# Patient Record
Sex: Female | Born: 1960 | Race: White | Hispanic: No | Smoking: Never smoker
Health system: Southern US, Community
[De-identification: ages and names within clinical notes are randomized; demographics above are authoritative.]

## PROBLEM LIST (undated history)

## (undated) DIAGNOSIS — E079 Disorder of thyroid, unspecified: Secondary | ICD-10-CM

## (undated) HISTORY — PX: MIDDLE EAR SURGERY: SHX713

## (undated) HISTORY — PX: TONSILLECTOMY: SUR1361

---

## 2004-06-29 ENCOUNTER — Ambulatory Visit: Payer: Self-pay | Admitting: Internal Medicine

## 2004-07-09 ENCOUNTER — Ambulatory Visit: Payer: Self-pay | Admitting: Family Medicine

## 2004-07-26 ENCOUNTER — Ambulatory Visit: Payer: Self-pay | Admitting: Family Medicine

## 2004-07-27 ENCOUNTER — Ambulatory Visit: Payer: Self-pay | Admitting: *Deleted

## 2004-08-11 ENCOUNTER — Ambulatory Visit: Payer: Self-pay | Admitting: Family Medicine

## 2004-09-09 ENCOUNTER — Emergency Department (HOSPITAL_COMMUNITY): Admission: EM | Admit: 2004-09-09 | Discharge: 2004-09-09 | Payer: Self-pay | Admitting: Emergency Medicine

## 2004-10-25 ENCOUNTER — Ambulatory Visit (HOSPITAL_COMMUNITY): Admission: RE | Admit: 2004-10-25 | Discharge: 2004-10-25 | Payer: Self-pay | Admitting: Internal Medicine

## 2004-10-25 ENCOUNTER — Ambulatory Visit: Payer: Self-pay | Admitting: Internal Medicine

## 2005-05-05 ENCOUNTER — Ambulatory Visit: Payer: Self-pay | Admitting: Family Medicine

## 2006-05-18 ENCOUNTER — Ambulatory Visit: Payer: Self-pay | Admitting: Family Medicine

## 2006-05-18 ENCOUNTER — Encounter (INDEPENDENT_AMBULATORY_CARE_PROVIDER_SITE_OTHER): Payer: Self-pay | Admitting: Family Medicine

## 2006-06-11 ENCOUNTER — Emergency Department (HOSPITAL_COMMUNITY): Admission: EM | Admit: 2006-06-11 | Discharge: 2006-06-11 | Payer: Self-pay | Admitting: Emergency Medicine

## 2006-07-10 ENCOUNTER — Ambulatory Visit: Payer: Self-pay | Admitting: Family Medicine

## 2006-09-26 ENCOUNTER — Ambulatory Visit: Payer: Self-pay | Admitting: Internal Medicine

## 2006-11-14 ENCOUNTER — Ambulatory Visit (HOSPITAL_COMMUNITY): Admission: RE | Admit: 2006-11-14 | Discharge: 2006-11-14 | Payer: Self-pay | Admitting: Family Medicine

## 2006-11-20 ENCOUNTER — Ambulatory Visit: Payer: Self-pay | Admitting: Internal Medicine

## 2006-11-20 LAB — CONVERTED CEMR LAB
LDL Cholesterol: 109 mg/dL — ABNORMAL HIGH (ref 0–99)
Total CHOL/HDL Ratio: 2.9
VLDL: 10 mg/dL (ref 0–40)

## 2006-11-22 ENCOUNTER — Encounter: Admission: RE | Admit: 2006-11-22 | Discharge: 2006-11-22 | Payer: Self-pay | Admitting: Family Medicine

## 2007-07-05 ENCOUNTER — Emergency Department (HOSPITAL_COMMUNITY): Admission: EM | Admit: 2007-07-05 | Discharge: 2007-07-05 | Payer: Self-pay | Admitting: General Surgery

## 2007-07-12 ENCOUNTER — Emergency Department (HOSPITAL_COMMUNITY): Admission: EM | Admit: 2007-07-12 | Discharge: 2007-07-12 | Payer: Self-pay | Admitting: Emergency Medicine

## 2007-07-31 ENCOUNTER — Emergency Department (HOSPITAL_COMMUNITY): Admission: EM | Admit: 2007-07-31 | Discharge: 2007-07-31 | Payer: Self-pay | Admitting: Family Medicine

## 2007-08-21 ENCOUNTER — Emergency Department (HOSPITAL_COMMUNITY): Admission: EM | Admit: 2007-08-21 | Discharge: 2007-08-21 | Payer: Self-pay | Admitting: Family Medicine

## 2007-10-04 ENCOUNTER — Ambulatory Visit: Payer: Self-pay | Admitting: Internal Medicine

## 2007-11-13 ENCOUNTER — Ambulatory Visit: Payer: Self-pay | Admitting: Internal Medicine

## 2008-01-28 ENCOUNTER — Encounter (INDEPENDENT_AMBULATORY_CARE_PROVIDER_SITE_OTHER): Payer: Self-pay | Admitting: Family Medicine

## 2008-01-28 ENCOUNTER — Ambulatory Visit: Payer: Self-pay | Admitting: Family Medicine

## 2008-02-06 ENCOUNTER — Encounter (INDEPENDENT_AMBULATORY_CARE_PROVIDER_SITE_OTHER): Payer: Self-pay | Admitting: Family Medicine

## 2008-02-06 ENCOUNTER — Ambulatory Visit: Payer: Self-pay | Admitting: Internal Medicine

## 2008-02-06 LAB — CONVERTED CEMR LAB
ALT: 13 units/L (ref 0–35)
Albumin: 4.1 g/dL (ref 3.5–5.2)
Alkaline Phosphatase: 46 units/L (ref 39–117)
Basophils Absolute: 0 10*3/uL (ref 0.0–0.1)
Calcium: 8.3 mg/dL — ABNORMAL LOW (ref 8.4–10.5)
Creatinine, Ser: 0.65 mg/dL (ref 0.40–1.20)
Eosinophils Relative: 4 % (ref 0–5)
Lymphocytes Relative: 37 % (ref 12–46)
Lymphs Abs: 2.1 10*3/uL (ref 0.7–4.0)
MCHC: 31.7 g/dL (ref 30.0–36.0)
Neutrophils Relative %: 47 % (ref 43–77)
Platelets: 313 10*3/uL (ref 150–400)
Potassium: 4.5 meq/L (ref 3.5–5.3)
RDW: 15.8 % — ABNORMAL HIGH (ref 11.5–15.5)
Total CHOL/HDL Ratio: 2.8
Total Protein: 7.1 g/dL (ref 6.0–8.3)
Triglycerides: 75 mg/dL (ref <150)

## 2008-02-11 ENCOUNTER — Encounter (INDEPENDENT_AMBULATORY_CARE_PROVIDER_SITE_OTHER): Payer: Self-pay | Admitting: Family Medicine

## 2008-02-11 LAB — CONVERTED CEMR LAB: Folate: 15.8 ng/mL

## 2008-02-20 ENCOUNTER — Ambulatory Visit (HOSPITAL_COMMUNITY): Admission: RE | Admit: 2008-02-20 | Discharge: 2008-02-20 | Payer: Self-pay | Admitting: Family Medicine

## 2008-03-17 ENCOUNTER — Ambulatory Visit: Payer: Self-pay | Admitting: Internal Medicine

## 2008-04-29 ENCOUNTER — Ambulatory Visit: Payer: Self-pay | Admitting: Internal Medicine

## 2009-01-10 ENCOUNTER — Emergency Department (HOSPITAL_COMMUNITY): Admission: EM | Admit: 2009-01-10 | Discharge: 2009-01-10 | Payer: Self-pay | Admitting: Emergency Medicine

## 2009-02-26 ENCOUNTER — Emergency Department (HOSPITAL_COMMUNITY): Admission: EM | Admit: 2009-02-26 | Discharge: 2009-02-26 | Payer: Self-pay | Admitting: Emergency Medicine

## 2009-07-30 ENCOUNTER — Emergency Department (HOSPITAL_COMMUNITY): Admission: EM | Admit: 2009-07-30 | Discharge: 2009-07-30 | Payer: Self-pay | Admitting: Family Medicine

## 2009-08-26 ENCOUNTER — Ambulatory Visit: Payer: Self-pay | Admitting: Internal Medicine

## 2009-08-26 LAB — CONVERTED CEMR LAB
AST: 16 units/L (ref 0–37)
Alkaline Phosphatase: 62 units/L (ref 39–117)
BUN: 16 mg/dL (ref 6–23)
Basophils Absolute: 0.1 10*3/uL (ref 0.0–0.1)
CO2: 26 meq/L (ref 19–32)
Chloride: 101 meq/L (ref 96–112)
Creatinine, Ser: 0.66 mg/dL (ref 0.40–1.20)
Ferritin: 28 ng/mL (ref 10–291)
Glucose, Bld: 87 mg/dL (ref 70–99)
HCT: 41.4 % (ref 36.0–46.0)
HDL: 60 mg/dL (ref >39)
Hemoglobin: 13.9 g/dL (ref 12.0–15.0)
Iron: 73 ug/dL (ref 42–145)
MCHC: 33.6 g/dL (ref 30.0–36.0)
Neutro Abs: 4 10*3/uL (ref 1.7–7.7)
Potassium: 4.8 meq/L (ref 3.5–5.3)
RBC: 4.59 M/uL (ref 3.87–5.11)
Saturation Ratios: 21 % (ref 20–55)
Sodium: 139 meq/L (ref 135–145)
Total CHOL/HDL Ratio: 3.6
UIBC: 279 ug/dL
VLDL: 35 mg/dL (ref 0–40)
Vitamin B-12: 589 pg/mL (ref 211–911)
WBC: 7.7 10*3/uL (ref 4.0–10.5)

## 2009-09-09 ENCOUNTER — Ambulatory Visit (HOSPITAL_COMMUNITY): Admission: RE | Admit: 2009-09-09 | Discharge: 2009-09-09 | Payer: Self-pay | Admitting: Internal Medicine

## 2009-11-08 ENCOUNTER — Emergency Department (HOSPITAL_COMMUNITY): Admission: EM | Admit: 2009-11-08 | Discharge: 2009-11-08 | Payer: Self-pay | Admitting: Family Medicine

## 2010-01-03 ENCOUNTER — Emergency Department (HOSPITAL_COMMUNITY): Admission: EM | Admit: 2010-01-03 | Discharge: 2010-01-03 | Payer: Self-pay | Admitting: Family Medicine

## 2011-02-19 ENCOUNTER — Emergency Department (HOSPITAL_COMMUNITY)
Admission: EM | Admit: 2011-02-19 | Discharge: 2011-02-19 | Disposition: A | Discharge status: Home / Self Care | Payer: Self-pay | Attending: Emergency Medicine | Admitting: Emergency Medicine

## 2011-02-19 ENCOUNTER — Encounter: Payer: Self-pay | Admitting: Emergency Medicine

## 2011-02-19 DIAGNOSIS — R51 Headache: Secondary | ICD-10-CM | POA: Insufficient documentation

## 2011-02-19 DIAGNOSIS — J3489 Other specified disorders of nose and nasal sinuses: Secondary | ICD-10-CM | POA: Insufficient documentation

## 2011-02-19 DIAGNOSIS — R059 Cough, unspecified: Secondary | ICD-10-CM | POA: Insufficient documentation

## 2011-02-19 DIAGNOSIS — R05 Cough: Secondary | ICD-10-CM | POA: Insufficient documentation

## 2011-02-19 DIAGNOSIS — Z9889 Other specified postprocedural states: Secondary | ICD-10-CM | POA: Insufficient documentation

## 2011-02-19 DIAGNOSIS — Z79899 Other long term (current) drug therapy: Secondary | ICD-10-CM | POA: Insufficient documentation

## 2011-02-19 DIAGNOSIS — H6690 Otitis media, unspecified, unspecified ear: Secondary | ICD-10-CM | POA: Insufficient documentation

## 2011-02-19 DIAGNOSIS — R0982 Postnasal drip: Secondary | ICD-10-CM | POA: Insufficient documentation

## 2011-02-19 DIAGNOSIS — H6692 Otitis media, unspecified, left ear: Secondary | ICD-10-CM

## 2011-02-19 DIAGNOSIS — H9209 Otalgia, unspecified ear: Secondary | ICD-10-CM | POA: Insufficient documentation

## 2011-02-19 DIAGNOSIS — R070 Pain in throat: Secondary | ICD-10-CM | POA: Insufficient documentation

## 2011-02-19 MED ORDER — OXYMETAZOLINE HCL 0.05 % NA SOLN: 2.0000 | Freq: Two times a day (BID) | NASAL | Status: AC

## 2011-02-19 MED ORDER — AMOXICILLIN 400 MG/5ML PO SUSR: 400.0000 mg | Freq: Three times a day (TID) | ORAL | Status: AC

## 2011-02-19 NOTE — ED Notes (Signed)
Pt states she has been sick for about a week  Pt states when she swallows her throat and ears hurt  Pt states her throat is really sore  Pt states she has had a little bit of a runny nose

## 2011-02-19 NOTE — ED Notes (Signed)
Pt a/o x 4, resting quietly, skin warm and dry, respirations even and unlabored, no acute distress noted

## 2011-02-19 NOTE — ED Provider Notes (Signed)
History     [name]: [id number] Arrival date & time: 02/19/2011  2:45 AM   First MD Initiated Contact with Patient 02/19/11 [phone]      Chief Complaint  Patient presents with  . Sore Throat  . Otalgia    (Consider location/radiation/quality/duration/timing/severity/associated sxs/prior treatment) Patient is a 50 y.o. female presenting with ear pain. The history is limited by the condition of the patient.  Otalgia This is a new problem. The current episode started more than 2 days ago. There is pain in both ears. The problem occurs constantly. The problem has been gradually worsening. There has been no fever. The pain is mild. Associated symptoms include headaches, rhinorrhea, sore throat and cough. Pertinent negatives include no ear discharge, no hearing loss, no abdominal pain, no diarrhea, no vomiting, no neck pain and no rash. Her past medical history is significant for tympanostomy tube.  Patient has had bilateral otalgia, worse when she swallows, for about a week. She additionally complains of sore throat and headache. She has had accompanying runny nose and cough. Patient does have a pertinent past medical history of a "T" tympanostomy tube in the right ear. She states this was placed many years ago, and that she is no longer followed by an ENT for it. Her primary care provider is [facility], and they monitor the tube for her. She denies ear discharge, fevers at home, chills, nausea, vomiting, visual disturbance.  History reviewed. No pertinent past medical history.  Past Surgical History  Procedure Date  . Middle ear surgery   . Tonsillectomy   . Cesarean section     Family History  Problem Relation Age of Onset  . Diabetes Mother   . Hypertension Father     History  Substance Use Topics  . Smoking status: Never Smoker   . Smokeless tobacco: Not on file  . Alcohol Use: Yes     occassional    OB History    Grav Para Term Preterm Abortions TAB SAB Ect Mult Living            Review of Systems  Constitutional: Negative for fever, chills, diaphoresis, activity change, appetite change, fatigue and unexpected weight change.  HENT: Positive for ear pain, congestion, sore throat, rhinorrhea and postnasal drip. Negative for hearing loss, nosebleeds, facial swelling, trouble swallowing, neck pain, neck stiffness, dental problem, voice change, sinus pressure, tinnitus and ear discharge.   Eyes: Negative for photophobia, discharge, redness and visual disturbance.  Respiratory: Positive for cough. Negative for chest tightness and wheezing.   Cardiovascular: Negative for chest pain and palpitations.  Gastrointestinal: Negative for nausea, vomiting, abdominal pain and diarrhea.  Skin: Negative for rash.  Neurological: Positive for headaches. Negative for dizziness, speech difficulty and numbness.    Allergies  Codeine and Vicodin  Home Medications   Current Outpatient Rx  Name Route Sig Dispense Refill  . PSEUDOEPHEDRINE HCL 30 MG PO TABS Oral Take 30 mg by mouth every 4 (four) hours as needed. Sinus congestion     . SERTRALINE HCL 100 MG PO TABS Oral Take 100 mg by mouth daily.        BP 107/62  Pulse 74  Temp(Src) 98.1 F (36.7 C) (Oral)  Resp 18  SpO2 100%  Physical Exam  Nursing note and vitals reviewed. Constitutional: She is oriented to person, place, and time. She appears well-developed and well-nourished. No distress.  HENT:  Head: Normocephalic and atraumatic.  Right Ear: External ear normal.  Left Ear: External ear normal.  Ears:  Nose: Nose normal.  Mouth/Throat: Oropharynx is clear and moist. No oropharyngeal exudate.       R tympanostomy tube patent. No drainage noted in the canal. The TM is not injected and does not appear to have effusion behind it. L TM appears to have a retraction pocket with possible air fluid level but drum does not appear injected and is not overtly bulging. Phonation normal, no voice muffling.  Eyes:  Conjunctivae and EOM are normal. Pupils are equal, round, and reactive to light.  Neck: Trachea normal and normal range of motion. Neck supple. No tracheal tenderness, no spinous process tenderness and no muscular tenderness present. No rigidity. No tracheal deviation and no edema present. No mass and no thyromegaly present.       TTP underneath chin at area of salivary glands  Cardiovascular: Normal rate, regular rhythm and normal heart sounds.   Pulmonary/Chest: Effort normal and breath sounds normal. No respiratory distress. She has no wheezes. She exhibits no tenderness.  Abdominal: Soft. Bowel sounds are normal. There is no tenderness.  Musculoskeletal: Normal range of motion. She exhibits no edema.  Lymphadenopathy:    She has no cervical adenopathy.  Neurological: She is alert and oriented to person, place, and time. No cranial nerve deficit.  Skin: Skin is warm and dry. No rash noted. She is not diaphoretic.  Psychiatric: She has a normal mood and affect.    ED Course  Procedures (including critical care time)  Labs Reviewed - No data to display No results found.   1. Otitis media of left ear       MDM  Given pt's hx of ear pathology and tube placement in the contralateral ear, will treat this as an otitis with amoxicillin. Her throat does not appear red or strep-like. She is afebrile and does not appear toxic. She was instructed to f/u with [facility] if she is not improving. She verbalized understanding and agreed to plan.     Medical screening examination/treatment/procedure(s) were performed by non-physician practitioner and as supervising physician I was immediately available for consultation/collaboration.   Erin Oconnell, [place] 02/19/11 1610  Sunnie Nielsen, MD 02/19/11 [phone]

## 2011-03-17 ENCOUNTER — Other Ambulatory Visit (HOSPITAL_COMMUNITY): Payer: Self-pay | Admitting: Family Medicine

## 2011-03-17 ENCOUNTER — Other Ambulatory Visit: Payer: Self-pay | Admitting: Family Medicine

## 2011-03-17 ENCOUNTER — Ambulatory Visit (HOSPITAL_COMMUNITY)
Admission: RE | Admit: 2011-03-17 | Discharge: 2011-03-17 | Disposition: A | Discharge status: Home / Self Care | Payer: Self-pay | Source: Ambulatory Visit | Attending: Family Medicine | Admitting: Family Medicine

## 2011-03-17 DIAGNOSIS — Z1231 Encounter for screening mammogram for malignant neoplasm of breast: Secondary | ICD-10-CM

## 2011-03-17 DIAGNOSIS — R52 Pain, unspecified: Secondary | ICD-10-CM

## 2011-03-17 DIAGNOSIS — M25539 Pain in unspecified wrist: Secondary | ICD-10-CM | POA: Insufficient documentation

## 2011-04-21 ENCOUNTER — Ambulatory Visit (HOSPITAL_COMMUNITY)
Admission: RE | Admit: 2011-04-21 | Discharge: 2011-04-21 | Disposition: A | Discharge status: Home / Self Care | Payer: Self-pay | Source: Ambulatory Visit | Attending: Family Medicine | Admitting: Family Medicine

## 2011-04-21 DIAGNOSIS — Z1231 Encounter for screening mammogram for malignant neoplasm of breast: Secondary | ICD-10-CM

## 2011-06-19 ENCOUNTER — Emergency Department (HOSPITAL_COMMUNITY): Payer: Self-pay

## 2011-06-19 ENCOUNTER — Encounter (HOSPITAL_COMMUNITY): Payer: Self-pay | Admitting: *Deleted

## 2011-06-19 ENCOUNTER — Emergency Department (HOSPITAL_COMMUNITY)
Admission: EM | Admit: 2011-06-19 | Discharge: 2011-06-19 | Disposition: A | Discharge status: Home / Self Care | Payer: Self-pay | Attending: Emergency Medicine | Admitting: Emergency Medicine

## 2011-06-19 DIAGNOSIS — M545 Low back pain, unspecified: Secondary | ICD-10-CM | POA: Insufficient documentation

## 2011-06-19 DIAGNOSIS — M549 Dorsalgia, unspecified: Secondary | ICD-10-CM | POA: Insufficient documentation

## 2011-06-19 MED ORDER — OXYCODONE-ACETAMINOPHEN 5-325 MG PO TABS: 2.0000 | ORAL_TABLET | ORAL | Status: AC | PRN

## 2011-06-19 MED ORDER — IBUPROFEN 800 MG PO TABS
800.0000 mg | ORAL_TABLET | Freq: Once | ORAL | Status: AC
  Administered 2011-06-19: 800 mg via ORAL
  Filled 2011-06-19: qty 1

## 2011-06-19 NOTE — Discharge Instructions (Signed)
Erin Oconnell x-ray films of your lower back today did not show any abnormalities or fractures. Take ibuprofen 800 mg every 6 hours with tubes x24 hours. Take the Percocet for severe pain but do not drive with this medication. He can try ice and heat intermittently. Followup with your primary care physician for this problem or the orthopedic physician listed below. Return to the ER for high fever severe pain or problems with bowel or bladder.  Back Pain, Adult Back pain is very common. The pain often gets better over time. The cause of back pain is usually not dangerous. Most people can learn to manage their back pain on their own.  HOME CARE   Stay active. Start with short walks on flat ground if you can. Try to walk farther each day.   Do not sit, drive, or stand in one place for more than 30 minutes. Do not stay in bed.   Do not avoid exercise or work. Activity can help your back heal faster.   Be careful when you bend or lift an object. Bend at your knees, keep the object close to you, and do not twist.   Sleep on a firm mattress. Lie on your side, and bend your knees. If you lie on your back, put a pillow under your knees.   Only take medicines as told by your doctor.   Put ice on the injured area.   Put ice in a plastic bag.   Place a towel between your skin and the bag.   Leave the ice on for 15 to 20 minutes, 3 to 4 times a day for the first 2 to 3 days. After that, you can switch between ice and heat packs.   Ask your doctor about back exercises or massage.   Avoid feeling anxious or stressed. Find good ways to deal with stress, such as exercise.  GET HELP RIGHT AWAY IF:   Your pain does not go away with rest or medicine.   Your pain does not go away in 1 week.   You have new problems.   You do not feel well.   The pain spreads into your legs.   You cannot control when you poop (bowel movement) or pee (urinate).   Your arms or legs feel weak or lose feeling (numbness).     You feel sick to your stomach (nauseous) or throw up (vomit).   You have belly (abdominal) pain.   You feel like you may pass out (faint).  MAKE SURE YOU:   Understand these instructions.   Will watch your condition.   Will get help right away if you are not doing well or get worse.  Document Released: 09/14/2007 Document Revised: 03/17/2011 Document Reviewed: 08/16/2010 [facility] Patient Information 2012 [place], [place].Back Exercises Back exercises help treat and prevent back injuries. The goal is to increase your strength in your belly (abdominal) and back muscles. These exercises can also help with flexibility. Start these exercises when told by your doctor. HOME CARE Back exercises include: Pelvic Tilt.  Lie on your back with your knees bent. Tilt your pelvis until the lower part of your back is against the floor. Hold this position 5 to 10 sec. Repeat this exercise 5 to 10 times.  Knee to Chest.  Pull 1 knee up against your chest and hold for 20 to 30 seconds. Repeat this with the other knee. This may be done with the other leg straight or bent, whichever feels better. Then, pull  both knees up against your chest.  Sit-Ups or Curl-Ups.  Bend your knees 90 degrees. Start with tilting your pelvis, and do a partial, slow sit-up. Only lift your upper half 30 to 45 degrees off the floor. Take at least 2 to 3 seonds for each sit-up. Do not do sit-ups with your knees out straight. If partial sit-ups are difficult, simply do the above but with only tightening your belly (abdominal) muscles and holding it as told.  Hip-Lift.  Lie on your back with your knees flexed 90 degrees. Push down with your feet and shoulders as you raise your hips 2 inches off the floor. Hold for 10 seconds, repeat 5 to 10 times.  Back Arches.  Lie on your stomach. Prop yourself up on bent elbows. Slowly press on your hands, causing an arch in your low back. Repeat 3 to 5 times.  Shoulder-Lifts.  Lie face down  with arms beside your body. Keep hips and belly pressed to floor as you slowly lift your head and shoulders off the floor.  Do not overdo your exercises. Be careful in the beginning. Exercises may cause you some mild back discomfort. If the pain lasts for more than 15 minutes, stop the exercises until you see your doctor. Improvement with exercise for back problems is slow.  Document Released: 04/30/2010 Document Revised: 03/17/2011 Document Reviewed: 04/30/2010 [facility] Patient Information 2012 [place], [place].

## 2011-06-19 NOTE — ED Notes (Signed)
Pt reports falling a wee and a half ago and landed on her bottom. States that she has a bruise on right hip/thigh region and has had lower back pain for the last week. Pt states she has been taking ASA for the pain with little relief. Came to the ER for further evaluation.

## 2011-06-19 NOTE — ED Provider Notes (Signed)
History     [name]: [id number]  Arrival date & time 06/19/11  1547   None     Chief Complaint  Patient presents with  . Back Pain    (Consider location/radiation/quality/duration/timing/severity/associated sxs/prior treatment) Patient is a 51 y.o. female presenting with back pain. The history is provided by the patient. No language interpreter was used.  Back Pain  This is a new problem. The current episode started more than 1 week ago. The problem occurs daily. The problem has been gradually worsening. The pain is present in the lumbar spine. The pain is at a severity of 8/10. The pain is moderate. Pertinent negatives include no fever, no numbness, no bowel incontinence, no bladder incontinence, no pelvic pain, no paresthesias, no paresis and no tingling.   Reports that she fell on a wet floor 7 days ago. Continuing to have pain especially when arising out of a chair to her lumbar area. States the pain does not radiate into her buttocks or legs. Denies bowel or bladder problems. Denies fever. Good lower extremities reflexes. Ambulating slowly without difficulty.   History reviewed. No pertinent past medical history.  Past Surgical History  Procedure Date  . Middle ear surgery   . Tonsillectomy   . Cesarean section     Family History  Problem Relation Age of Onset  . Diabetes Mother   . Hypertension Father     History  Substance Use Topics  . Smoking status: Never Smoker   . Smokeless tobacco: Not on file  . Alcohol Use: Yes     occassional    OB History    Grav Para Term Preterm Abortions TAB SAB Ect Mult Living                  Review of Systems  Constitutional: Negative for fever.  Gastrointestinal: Negative for bowel incontinence.  Genitourinary: Negative for bladder incontinence and pelvic pain.  Musculoskeletal: Positive for back pain.  Neurological: Negative for tingling, numbness and paresthesias.  All other systems reviewed and are  negative.    Allergies  Codeine and Vicodin  Home Medications   Current Outpatient Rx  Name Route Sig Dispense Refill  . ASPIRIN 325 MG PO TABS Oral Take 325 mg by mouth daily.    . ADULT MULTIVITAMIN W/MINERALS CH Oral Take 1 tablet by mouth daily.    . SERTRALINE HCL 100 MG PO TABS Oral Take 100 mg by mouth daily.        BP 111/62  Pulse 75  Temp(Src) 98.3 F (36.8 C) (Oral)  Resp 16  Ht 5' (1.524 m)  Wt 135 lb (61.236 kg)  BMI 26.37 kg/m2  SpO2 100%  Physical Exam  Nursing note and vitals reviewed. Constitutional: She is oriented to person, place, and time. She appears well-developed and well-nourished.  HENT:  Head: Normocephalic and atraumatic.  Eyes: Conjunctivae and EOM are normal. Pupils are equal, round, and reactive to light.  Neck: Normal range of motion. Neck supple.  Cardiovascular: Normal rate, regular rhythm, normal heart sounds and intact distal pulses.  Exam reveals no gallop and no friction rub.   No murmur heard. Pulmonary/Chest: Effort normal and breath sounds normal.  Abdominal: Soft. Bowel sounds are normal.  Musculoskeletal: Normal range of motion. She exhibits tenderness. She exhibits no edema.       Point tenderness to Lumbar spine  Neurological: She is alert and oriented to person, place, and time. She has normal reflexes. She displays normal reflexes. No cranial nerve  deficit. Coordination normal.  Skin: Skin is warm and dry.  Psychiatric: She has a normal mood and affect.    ED Course  Procedures (including critical care time)  Labs Reviewed - No data to display No results found.   No diagnosis found.    MDM   51 year old female here after a fall 7 days ago complaining of lumbar spine point tenderness. States that the pain comes that she is arising out of a chair most of the time.  X-rays today show no abnormal T2 the lumbar spine on the plain film. We'll treat her pain with ice and Percocet and have her followup with sore throat.  Return for bowel bladder problems high fever or severe pain.       Jethro Bastos, NP 06/19/11 2019

## 2011-06-19 NOTE — ED Provider Notes (Signed)
Medical screening examination/treatment/procedure(s) were performed by non-physician practitioner and as supervising physician I was immediately available for consultation/collaboration.   Yarelli Decelles A Dereck Agerton, MD 06/19/11 2319 

## 2012-05-31 ENCOUNTER — Telehealth (HOSPITAL_COMMUNITY): Payer: Self-pay | Admitting: *Deleted

## 2012-05-31 NOTE — Telephone Encounter (Signed)
Talked with patient 

## 2012-06-14 ENCOUNTER — Other Ambulatory Visit: Payer: Self-pay | Admitting: Obstetrics and Gynecology

## 2012-06-14 DIAGNOSIS — Z1231 Encounter for screening mammogram for malignant neoplasm of breast: Secondary | ICD-10-CM

## 2012-06-27 ENCOUNTER — Encounter (HOSPITAL_COMMUNITY): Payer: Self-pay | Admitting: *Deleted

## 2012-07-10 ENCOUNTER — Ambulatory Visit (HOSPITAL_COMMUNITY)
Admission: RE | Admit: 2012-07-10 | Discharge: 2012-07-10 | Disposition: A | Discharge status: Home / Self Care | Payer: Self-pay | Source: Ambulatory Visit | Attending: Obstetrics and Gynecology | Admitting: Obstetrics and Gynecology

## 2012-07-10 ENCOUNTER — Encounter (HOSPITAL_COMMUNITY): Payer: Self-pay

## 2012-07-10 VITALS — BP 112/64 | Temp 98.0°F | Ht 60.0 in | Wt 135.2 lb

## 2012-07-10 DIAGNOSIS — Z1239 Encounter for other screening for malignant neoplasm of breast: Secondary | ICD-10-CM

## 2012-07-10 DIAGNOSIS — Z1231 Encounter for screening mammogram for malignant neoplasm of breast: Secondary | ICD-10-CM

## 2012-07-10 HISTORY — DX: Disorder of thyroid, unspecified: E07.9

## 2012-07-10 NOTE — Patient Instructions (Signed)
Taught patient how to perform BSE. Patient did not need a Pap smear today due to last Pap smear was 03/17/2011. Told patient about free cervical cancer screenings to receive a Pap smear if would like one next year. Let her know [facility] will cover Pap smears every 3 years unless has a history of abnormal Pap smears. Let patient know will follow up with her within the next couple weeks with results by letter or phone. Patient verbalized understanding. Patient escorted to mammography for a screening mammogram.

## 2012-07-10 NOTE — Progress Notes (Signed)
No complaints today.  Pap Smear:    Pap smear not completed today. Last Pap smear was 03/17/2011 at [facility] and normal. Per patient has a history of an abnormal Pap smear 20 years ago that required a colposcopy and ? [facility] for follow up. Per patient no abnormal Pap smears since. Last Pap smear result is in EPIC.  Physical exam: Breasts Breasts symmetrical. No skin abnormalities bilateral breasts. No nipple retraction bilateral breasts. No nipple discharge bilateral breasts. No lymphadenopathy. No lumps palpated bilateral breasts. No complaints of pain or tenderness on exam. Patient escorted to mammography for a screening mammogram.        Pelvic/Bimanual No Pap smear completed today since last Pap smear was 03/17/2011. Pap smear not indicated per [facility] guidelines.

## 2012-07-11 ENCOUNTER — Other Ambulatory Visit: Payer: Self-pay | Admitting: Obstetrics and Gynecology

## 2012-07-11 DIAGNOSIS — R928 Other abnormal and inconclusive findings on diagnostic imaging of breast: Secondary | ICD-10-CM

## 2012-07-26 ENCOUNTER — Other Ambulatory Visit: Payer: Self-pay

## 2012-07-30 ENCOUNTER — Encounter (HOSPITAL_COMMUNITY): Payer: Self-pay | Admitting: *Deleted

## 2012-08-08 ENCOUNTER — Other Ambulatory Visit: Payer: Self-pay

## 2012-08-09 ENCOUNTER — Ambulatory Visit
Admission: RE | Admit: 2012-08-09 | Discharge: 2012-08-09 | Disposition: A | Discharge status: Home / Self Care | Payer: No Typology Code available for payment source | Source: Ambulatory Visit | Attending: Obstetrics and Gynecology | Admitting: Obstetrics and Gynecology

## 2012-08-09 ENCOUNTER — Other Ambulatory Visit: Payer: Self-pay

## 2012-08-09 DIAGNOSIS — R928 Other abnormal and inconclusive findings on diagnostic imaging of breast: Secondary | ICD-10-CM

## 2013-01-14 ENCOUNTER — Other Ambulatory Visit: Payer: Self-pay | Admitting: Obstetrics and Gynecology

## 2013-01-14 DIAGNOSIS — N6320 Unspecified lump in the left breast, unspecified quadrant: Secondary | ICD-10-CM

## 2013-02-20 ENCOUNTER — Ambulatory Visit
Admission: RE | Admit: 2013-02-20 | Discharge: 2013-02-20 | Disposition: A | Discharge status: Home / Self Care | Payer: No Typology Code available for payment source | Source: Ambulatory Visit | Attending: Obstetrics and Gynecology | Admitting: Obstetrics and Gynecology

## 2013-02-20 DIAGNOSIS — N6320 Unspecified lump in the left breast, unspecified quadrant: Secondary | ICD-10-CM

## 2013-07-16 ENCOUNTER — Other Ambulatory Visit: Payer: Self-pay | Admitting: Family Medicine

## 2013-07-16 DIAGNOSIS — N6320 Unspecified lump in the left breast, unspecified quadrant: Secondary | ICD-10-CM

## 2013-08-02 ENCOUNTER — Encounter (INDEPENDENT_AMBULATORY_CARE_PROVIDER_SITE_OTHER): Payer: Self-pay

## 2013-08-02 ENCOUNTER — Ambulatory Visit
Admission: RE | Admit: 2013-08-02 | Discharge: 2013-08-02 | Disposition: A | Discharge status: Home / Self Care | Payer: 59 | Source: Ambulatory Visit | Attending: Family Medicine | Admitting: Family Medicine

## 2013-08-02 DIAGNOSIS — N6320 Unspecified lump in the left breast, unspecified quadrant: Secondary | ICD-10-CM

## 2014-02-10 ENCOUNTER — Encounter (HOSPITAL_COMMUNITY): Payer: Self-pay

## 2014-04-15 ENCOUNTER — Ambulatory Visit (INDEPENDENT_AMBULATORY_CARE_PROVIDER_SITE_OTHER): Payer: 59 | Admitting: Emergency Medicine

## 2014-04-15 VITALS — BP 118/76 | HR 68 | Temp 97.8°F | Resp 18 | Ht 60.0 in | Wt 128.6 lb

## 2014-04-15 DIAGNOSIS — L309 Dermatitis, unspecified: Secondary | ICD-10-CM

## 2014-04-15 DIAGNOSIS — L259 Unspecified contact dermatitis, unspecified cause: Secondary | ICD-10-CM

## 2014-04-15 MED ORDER — TRIAMCINOLONE ACETONIDE 0.1 % EX CREA: 1.0000 "application " | TOPICAL_CREAM | Freq: Two times a day (BID) | CUTANEOUS | Status: DC

## 2014-04-15 NOTE — Progress Notes (Signed)
[facility] [address], [place] [id number] [phone]  Date:  04/15/2014   Name:  Erin [place]Lisa T Titus   DOB:  May 27, 1960   MRN:  [id number]  PCP:  No primary care provider on file.    Chief Complaint: Hair/Scalp Problem   History of Present Illness:  Erin Oconnell is a 54 y.o. very pleasant female patient who presents with the following:  Patient has a long (years) history of "itchy bumps" all over her head. She has seen a number of doctors and dermatologists who all have encouraged her to stop scratching and picking at her scalp Tried meds have not improved the situation She comes now to have it evaluated No improvement with over the counter medications or other home remedies.  Denies other complaint or health concern today.   There are no active problems to display for this patient.   Past Medical History  Diagnosis Date  . Thyroid disease     Past Surgical History  Procedure Laterality Date  . Middle ear surgery    . Tonsillectomy    . Cesarean section      History  Substance Use Topics  . Smoking status: Never Smoker   . Smokeless tobacco: Never Used  . Alcohol Use: Yes     Comment: occassional    Family History  Problem Relation Age of Onset  . Diabetes Mother   . Hypertension Father   . Heart attack Father   . Heart disease Brother   . Hypertension Brother   . Hypertension Brother     Allergies  Allergen Reactions  . Codeine Itching  . Vicodin [Hydrocodone-Acetaminophen] Itching    Medication list has been reviewed and updated.  Current Outpatient Prescriptions on File Prior to Visit  Medication Sig Dispense Refill  . aspirin 325 MG tablet Take 325 mg by mouth daily.    Marland Kitchen. levothyroxine (SYNTHROID, LEVOTHROID) 25 MCG tablet Take 25 mcg by mouth daily before breakfast.    . sertraline (ZOLOFT) 100 MG tablet Take 100 mg by mouth daily.      . Multiple Vitamin (MULITIVITAMIN WITH MINERALS) TABS Take 1 tablet by mouth daily.      No current facility-administered medications on file prior to visit.    Review of Systems:  As per HPI, otherwise negative.    Physical Examination: Filed Vitals:   04/15/14 1714  BP: 118/76  Pulse: 68  Temp: 97.8 F (36.6 C)  Resp: 18   Filed Vitals:   04/15/14 1714  Height: 5' (1.524 m)  Weight: 128 lb 9.6 oz (58.333 kg)   Body mass index is 25.12 kg/(m^2). Ideal Body Weight: Weight in (lb) to have BMI = 25: 127.7  GEN: WDWN, NAD, Non-toxic, A & O x 3 HEENT: Atraumatic, Normocephalic. Neck supple. No masses, No LAD. Ears and Nose: No external deformity. CV: RRR, No M/G/R. No JVD. No thrill. No extra heart sounds. PULM: CTA B, no wheezes, crackles, rhonchi. No retractions. No resp. distress. No accessory muscle use. ABD: S, NT, ND, +BS. No rebound. No HSM. EXTR: No c/c/e NEURO Normal gait.  PSYCH: Normally interactive. Conversant. Not depressed or anxious appearing.  Calm demeanor.  SCALP:  5-6 scabbed lesions on scalp   Assessment and Plan: Eczema Keep her hands off her head Wear gloves at bedtime TAC F/u as needed Signed,  Phillips OdorJeffery Karol Skarzynski, MD

## 2014-04-15 NOTE — Patient Instructions (Signed)
Eczema Eczema, also called atopic dermatitis, is a skin disorder that causes inflammation of the skin. It causes a red rash and dry, scaly skin. The skin becomes very itchy. Eczema is generally worse during the cooler winter months and often improves with the warmth of summer. Eczema usually starts showing signs in infancy. Some children outgrow eczema, but it may last through adulthood.  CAUSES  The exact cause of eczema is not known, but it appears to run in families. People with eczema often have a family history of eczema, allergies, asthma, or hay fever. Eczema is not contagious. Flare-ups of the condition may be caused by:   Contact with something you are sensitive or allergic to.   Stress. SIGNS AND SYMPTOMS  Dry, scaly skin.   Red, itchy rash.   Itchiness. This may occur before the skin rash and may be very intense.  DIAGNOSIS  The diagnosis of eczema is usually made based on symptoms and medical history. TREATMENT  Eczema cannot be cured, but symptoms usually can be controlled with treatment and other strategies. A treatment plan might include:  Controlling the itching and scratching.   Use over-the-counter antihistamines as directed for itching. This is especially useful at night when the itching tends to be worse.   Use over-the-counter steroid creams as directed for itching.   Avoid scratching. Scratching makes the rash and itching worse. It may also result in a skin infection (impetigo) due to a break in the skin caused by scratching.   Keeping the skin well moisturized with creams every day. This will seal in moisture and help prevent dryness. Lotions that contain alcohol and water should be avoided because they can dry the skin.   Limiting exposure to things that you are sensitive or allergic to (allergens).   Recognizing situations that cause stress.   Developing a plan to manage stress.  HOME CARE INSTRUCTIONS   Only take over-the-counter or  prescription medicines as directed by your health care provider.   Do not use anything on the skin without checking with your health care provider.   Keep baths or showers short (5 minutes) in warm (not hot) water. Use mild cleansers for bathing. These should be unscented. You may add nonperfumed bath oil to the bath water. It is best to avoid soap and bubble bath.   Immediately after a bath or shower, when the skin is still damp, apply a moisturizing ointment to the entire body. This ointment should be a petroleum ointment. This will seal in moisture and help prevent dryness. The thicker the ointment, the better. These should be unscented.   Keep fingernails cut short. Children with eczema may need to wear soft gloves or mittens at night after applying an ointment.   Dress in clothes made of cotton or cotton blends. Dress lightly, because heat increases itching.   A child with eczema should stay away from anyone with fever blisters or cold sores. The virus that causes fever blisters (herpes simplex) can cause a serious skin infection in children with eczema. SEEK MEDICAL CARE IF:   Your itching interferes with sleep.   Your rash gets worse or is not better within 1 week after starting treatment.   You see pus or soft yellow scabs in the rash area.   You have a fever.   You have a rash flare-up after contact with someone who has fever blisters.  Document Released: 03/25/2000 Document Revised: 01/16/2013 Document Reviewed: 10/29/2012 [employer] Patient Information 2015 [employer]. This information   is not intended to replace advice given to you by your health care provider. Make sure you discuss any questions you have with your health care provider.  

## 2014-07-01 ENCOUNTER — Other Ambulatory Visit: Payer: Self-pay | Admitting: Family Medicine

## 2014-07-01 DIAGNOSIS — Z1231 Encounter for screening mammogram for malignant neoplasm of breast: Secondary | ICD-10-CM

## 2014-07-01 DIAGNOSIS — N6320 Unspecified lump in the left breast, unspecified quadrant: Secondary | ICD-10-CM

## 2014-07-03 ENCOUNTER — Other Ambulatory Visit: Payer: Self-pay | Admitting: Family Medicine

## 2014-07-03 DIAGNOSIS — N6320 Unspecified lump in the left breast, unspecified quadrant: Secondary | ICD-10-CM

## 2014-08-05 ENCOUNTER — Ambulatory Visit
Admission: RE | Admit: 2014-08-05 | Discharge: 2014-08-05 | Disposition: A | Discharge status: Home / Self Care | Payer: 59 | Source: Ambulatory Visit | Attending: Family Medicine | Admitting: Family Medicine

## 2014-08-05 DIAGNOSIS — N6320 Unspecified lump in the left breast, unspecified quadrant: Secondary | ICD-10-CM

## 2014-08-12 ENCOUNTER — Other Ambulatory Visit: Payer: Self-pay

## 2014-08-12 NOTE — Telephone Encounter (Signed)
Requesting a refill of cortisone cream

## 2014-08-13 NOTE — Telephone Encounter (Signed)
refill 

## 2014-08-15 MED ORDER — TRIAMCINOLONE ACETONIDE 0.1 % EX CREA: 1.0000 "application " | TOPICAL_CREAM | Freq: Two times a day (BID) | CUTANEOUS | Status: DC

## 2014-08-15 NOTE — Telephone Encounter (Signed)
Dr Dareen PianoAnderson, can we give RFs of this for pt's scalp eczema? Or larger size tube?

## 2014-08-15 NOTE — Telephone Encounter (Signed)
Patient called to check the status of her refill.

## 2014-09-30 ENCOUNTER — Ambulatory Visit
Admission: RE | Admit: 2014-09-30 | Discharge: 2014-09-30 | Disposition: A | Discharge status: Home / Self Care | Payer: 59 | Source: Ambulatory Visit | Attending: Family Medicine | Admitting: Family Medicine

## 2014-09-30 ENCOUNTER — Other Ambulatory Visit: Payer: Self-pay | Admitting: Family Medicine

## 2014-09-30 DIAGNOSIS — R1084 Generalized abdominal pain: Secondary | ICD-10-CM

## 2015-04-28 ENCOUNTER — Other Ambulatory Visit (HOSPITAL_COMMUNITY)
Admission: RE | Admit: 2015-04-28 | Discharge: 2015-04-28 | Disposition: A | Discharge status: Home / Self Care | Payer: BLUE CROSS/BLUE SHIELD | Source: Ambulatory Visit | Attending: Family Medicine | Admitting: Family Medicine

## 2015-04-28 ENCOUNTER — Other Ambulatory Visit: Payer: Self-pay | Admitting: Family Medicine

## 2015-04-28 DIAGNOSIS — Z01419 Encounter for gynecological examination (general) (routine) without abnormal findings: Secondary | ICD-10-CM | POA: Diagnosis present

## 2015-04-29 LAB — CYTOLOGY - PAP

## 2015-08-06 ENCOUNTER — Other Ambulatory Visit: Payer: Self-pay

## 2015-08-06 DIAGNOSIS — Z1231 Encounter for screening mammogram for malignant neoplasm of breast: Secondary | ICD-10-CM

## 2015-10-06 ENCOUNTER — Ambulatory Visit
Admission: RE | Admit: 2015-10-06 | Discharge: 2015-10-06 | Disposition: A | Discharge status: Home / Self Care | Payer: BLUE CROSS/BLUE SHIELD | Source: Ambulatory Visit

## 2015-10-06 ENCOUNTER — Other Ambulatory Visit: Payer: Self-pay | Admitting: Family Medicine

## 2015-10-06 DIAGNOSIS — Z1231 Encounter for screening mammogram for malignant neoplasm of breast: Secondary | ICD-10-CM

## 2016-01-09 ENCOUNTER — Encounter (HOSPITAL_COMMUNITY): Payer: Self-pay | Admitting: *Deleted

## 2016-01-09 ENCOUNTER — Ambulatory Visit (HOSPITAL_COMMUNITY)
Admission: EM | Admit: 2016-01-09 | Discharge: 2016-01-09 | Disposition: A | Discharge status: Home / Self Care | Payer: BLUE CROSS/BLUE SHIELD | Attending: Family Medicine | Admitting: Family Medicine

## 2016-01-09 DIAGNOSIS — H6012 Cellulitis of left external ear: Secondary | ICD-10-CM

## 2016-01-09 MED ORDER — CEPHALEXIN 500 MG PO CAPS: 500.0000 mg | ORAL_CAPSULE | Freq: Three times a day (TID) | ORAL | 0 refills | Status: DC

## 2016-01-09 MED ORDER — MUPIROCIN 2 % EX OINT
TOPICAL_OINTMENT | CUTANEOUS | 0 refills | Status: DC
Start: 2016-01-09 — End: 2019-07-11

## 2016-01-09 NOTE — ED Triage Notes (Signed)
Pt   Reports   Pain   l      Ear      She         Reports      X  1  Week       Pt  Reports   advil      And  Aspirin        Pt   denys  Any  Known injury

## 2016-01-09 NOTE — ED Provider Notes (Signed)
[facility]    [name]: [id number] Arrival date & time: 01/09/16  1824     History   Chief Complaint No chief complaint on file.   HPI Erin Oconnell is a 55 y.o. female.    Otalgia  Location:  Left Behind ear:  No abnormality Quality:  Sore Severity:  Mild Onset quality:  Gradual Duration:  2 days Progression:  Unchanged Chronicity:  New Relieved by:  None tried Worsened by:  Nothing Ineffective treatments:  None tried Associated symptoms: no ear discharge and no fever   Risk factors: prior ear surgery     Past Medical History:  Diagnosis Date  . Thyroid disease     There are no active problems to display for this patient.   Past Surgical History:  Procedure Laterality Date  . CESAREAN SECTION    . MIDDLE EAR SURGERY    . TONSILLECTOMY      OB History    Gravida Para Term Preterm AB Living   1 1 1     1    SAB TAB Ectopic Multiple Live Births                   Home Medications    Prior to Admission medications   Medication Sig Start Date End Date Taking? Authorizing Provider  aspirin 325 MG tablet Take 325 mg by mouth daily.    Historical Provider, MD  levothyroxine (SYNTHROID, LEVOTHROID) 25 MCG tablet Take 25 mcg by mouth daily before breakfast.    Historical Provider, MD  Multiple Vitamin (MULITIVITAMIN WITH MINERALS) TABS Take 1 tablet by mouth daily.    Historical Provider, MD  sertraline (ZOLOFT) 100 MG tablet Take 100 mg by mouth daily.      Historical Provider, MD  triamcinolone cream (KENALOG) 0.1 % Apply 1 application topically 2 (two) times daily. 08/15/14   Carmelina DaneJeffery S Anderson, MD    Family History Family History  Problem Relation Age of Onset  . Diabetes Mother   . Hypertension Father   . Heart attack Father   . Heart disease Brother   . Hypertension Brother   . Hypertension Brother     Social History Social History  Substance Use Topics  . Smoking status: Never Smoker  . Smokeless tobacco: Never Used  . Alcohol use  Yes     Comment: occassional     Allergies   Codeine and Vicodin [hydrocodone-acetaminophen]   Review of Systems Review of Systems  Constitutional: Negative.  Negative for fever.  HENT: Positive for ear pain. Negative for ear discharge.   All other systems reviewed and are negative.    Physical Exam Triage Vital Signs ED Triage Vitals [01/09/16 1926]  Enc Vitals Group     BP 144/79     Pulse Rate 64     Resp 16     Temp 98 F (36.7 C)     Temp Source Oral     SpO2 100 %     Weight      Height      Head Circumference      Peak Flow      Pain Score      Pain Loc      Pain Edu?      Excl. in GC?    No data found.   Updated Vital Signs BP 144/79 (BP Location: Left Arm)   Pulse 64   Temp 98 F (36.7 C) (Oral)   Resp 16   SpO2 100%  Visual Acuity Right Eye Distance:   Left Eye Distance:   Bilateral Distance:    Right Eye Near:   Left Eye Near:    Bilateral Near:     Physical Exam  HENT:  Head: Normocephalic.  Right Ear: Tympanic membrane and external ear normal.  Left Ear: Tympanic membrane and ear canal normal.  Ears:  Neck: Normal range of motion.  Lymphadenopathy:    She has no cervical adenopathy.  Nursing note and vitals reviewed.    UC Treatments / Results  Labs (all labs ordered are listed, but only abnormal results are displayed) Labs Reviewed - No data to display  EKG  EKG Interpretation None       Radiology No results found.  Procedures Procedures (including critical care time)  Medications Ordered in UC Medications - No data to display   Initial Impression / Assessment and Plan / UC Course  I have reviewed the triage vital signs and the nursing notes.  Pertinent labs & imaging results that were available during my care of the patient were reviewed by me and considered in my medical decision making (see chart for details).  Clinical Course      Final Clinical Impressions(s) / UC Diagnoses   Final diagnoses:    None    New Prescriptions New Prescriptions   No medications on file     Linna Hoff, MD 01/09/16 [phone]

## 2016-01-09 NOTE — Discharge Instructions (Signed)
Warm cloth then ointment and antibiotic pill until finished medicine, return as needed.

## 2016-01-26 ENCOUNTER — Telehealth (HOSPITAL_COMMUNITY): Payer: Self-pay | Admitting: Emergency Medicine

## 2016-01-26 NOTE — Telephone Encounter (Signed)
Rec'd note from front staff to call pt   Note says pt was seen on 9/30 for left ear inf  Pt finished meds but still net feeling better and wants to know if we can call in another Rx  LM on VM [phone]

## 2016-09-06 ENCOUNTER — Other Ambulatory Visit: Payer: Self-pay | Admitting: Family Medicine

## 2016-09-06 DIAGNOSIS — Z1231 Encounter for screening mammogram for malignant neoplasm of breast: Secondary | ICD-10-CM

## 2016-10-11 ENCOUNTER — Ambulatory Visit: Payer: BLUE CROSS/BLUE SHIELD

## 2016-11-22 ENCOUNTER — Ambulatory Visit: Payer: BLUE CROSS/BLUE SHIELD

## 2017-01-11 ENCOUNTER — Ambulatory Visit: Payer: BLUE CROSS/BLUE SHIELD

## 2017-02-07 ENCOUNTER — Ambulatory Visit: Payer: BLUE CROSS/BLUE SHIELD

## 2017-04-17 ENCOUNTER — Ambulatory Visit
Admission: RE | Admit: 2017-04-17 | Discharge: 2017-04-17 | Disposition: A | Discharge status: Home / Self Care | Payer: BLUE CROSS/BLUE SHIELD | Source: Ambulatory Visit | Attending: Family Medicine | Admitting: Family Medicine

## 2017-04-17 DIAGNOSIS — Z1231 Encounter for screening mammogram for malignant neoplasm of breast: Secondary | ICD-10-CM

## 2017-07-20 DIAGNOSIS — H9211 Otorrhea, right ear: Secondary | ICD-10-CM | POA: Insufficient documentation

## 2017-07-20 DIAGNOSIS — H6991 Unspecified Eustachian tube disorder, right ear: Secondary | ICD-10-CM | POA: Insufficient documentation

## 2017-08-15 DIAGNOSIS — H903 Sensorineural hearing loss, bilateral: Secondary | ICD-10-CM | POA: Insufficient documentation

## 2018-10-09 DIAGNOSIS — E785 Hyperlipidemia, unspecified: Secondary | ICD-10-CM | POA: Insufficient documentation

## 2018-10-09 DIAGNOSIS — R350 Frequency of micturition: Secondary | ICD-10-CM | POA: Insufficient documentation

## 2018-10-09 DIAGNOSIS — F429 Obsessive-compulsive disorder, unspecified: Secondary | ICD-10-CM | POA: Insufficient documentation

## 2018-10-09 DIAGNOSIS — E039 Hypothyroidism, unspecified: Secondary | ICD-10-CM | POA: Insufficient documentation

## 2018-10-22 DIAGNOSIS — M85859 Other specified disorders of bone density and structure, unspecified thigh: Secondary | ICD-10-CM | POA: Insufficient documentation

## 2018-10-22 DIAGNOSIS — M8589 Other specified disorders of bone density and structure, multiple sites: Secondary | ICD-10-CM | POA: Insufficient documentation

## 2019-07-11 ENCOUNTER — Other Ambulatory Visit: Payer: Self-pay

## 2019-07-11 ENCOUNTER — Ambulatory Visit (INDEPENDENT_AMBULATORY_CARE_PROVIDER_SITE_OTHER): Payer: BLUE CROSS/BLUE SHIELD

## 2019-07-11 ENCOUNTER — Ambulatory Visit
Admission: EM | Admit: 2019-07-11 | Discharge: 2019-07-11 | Disposition: A | Discharge status: Home / Self Care | Payer: BLUE CROSS/BLUE SHIELD | Attending: Emergency Medicine | Admitting: Emergency Medicine

## 2019-07-11 DIAGNOSIS — J209 Acute bronchitis, unspecified: Secondary | ICD-10-CM

## 2019-07-11 DIAGNOSIS — R05 Cough: Secondary | ICD-10-CM | POA: Diagnosis not present

## 2019-07-11 DIAGNOSIS — R059 Cough, unspecified: Secondary | ICD-10-CM

## 2019-07-11 IMAGING — DX DG CHEST 2V
2 series · 2 of 2 positions shown · non-contrast
Comparison: [DATE]

CLINICAL DATA: Cough for 1 week, nasal congestion

EXAM:
CHEST - 2 VIEW

[chest pa]
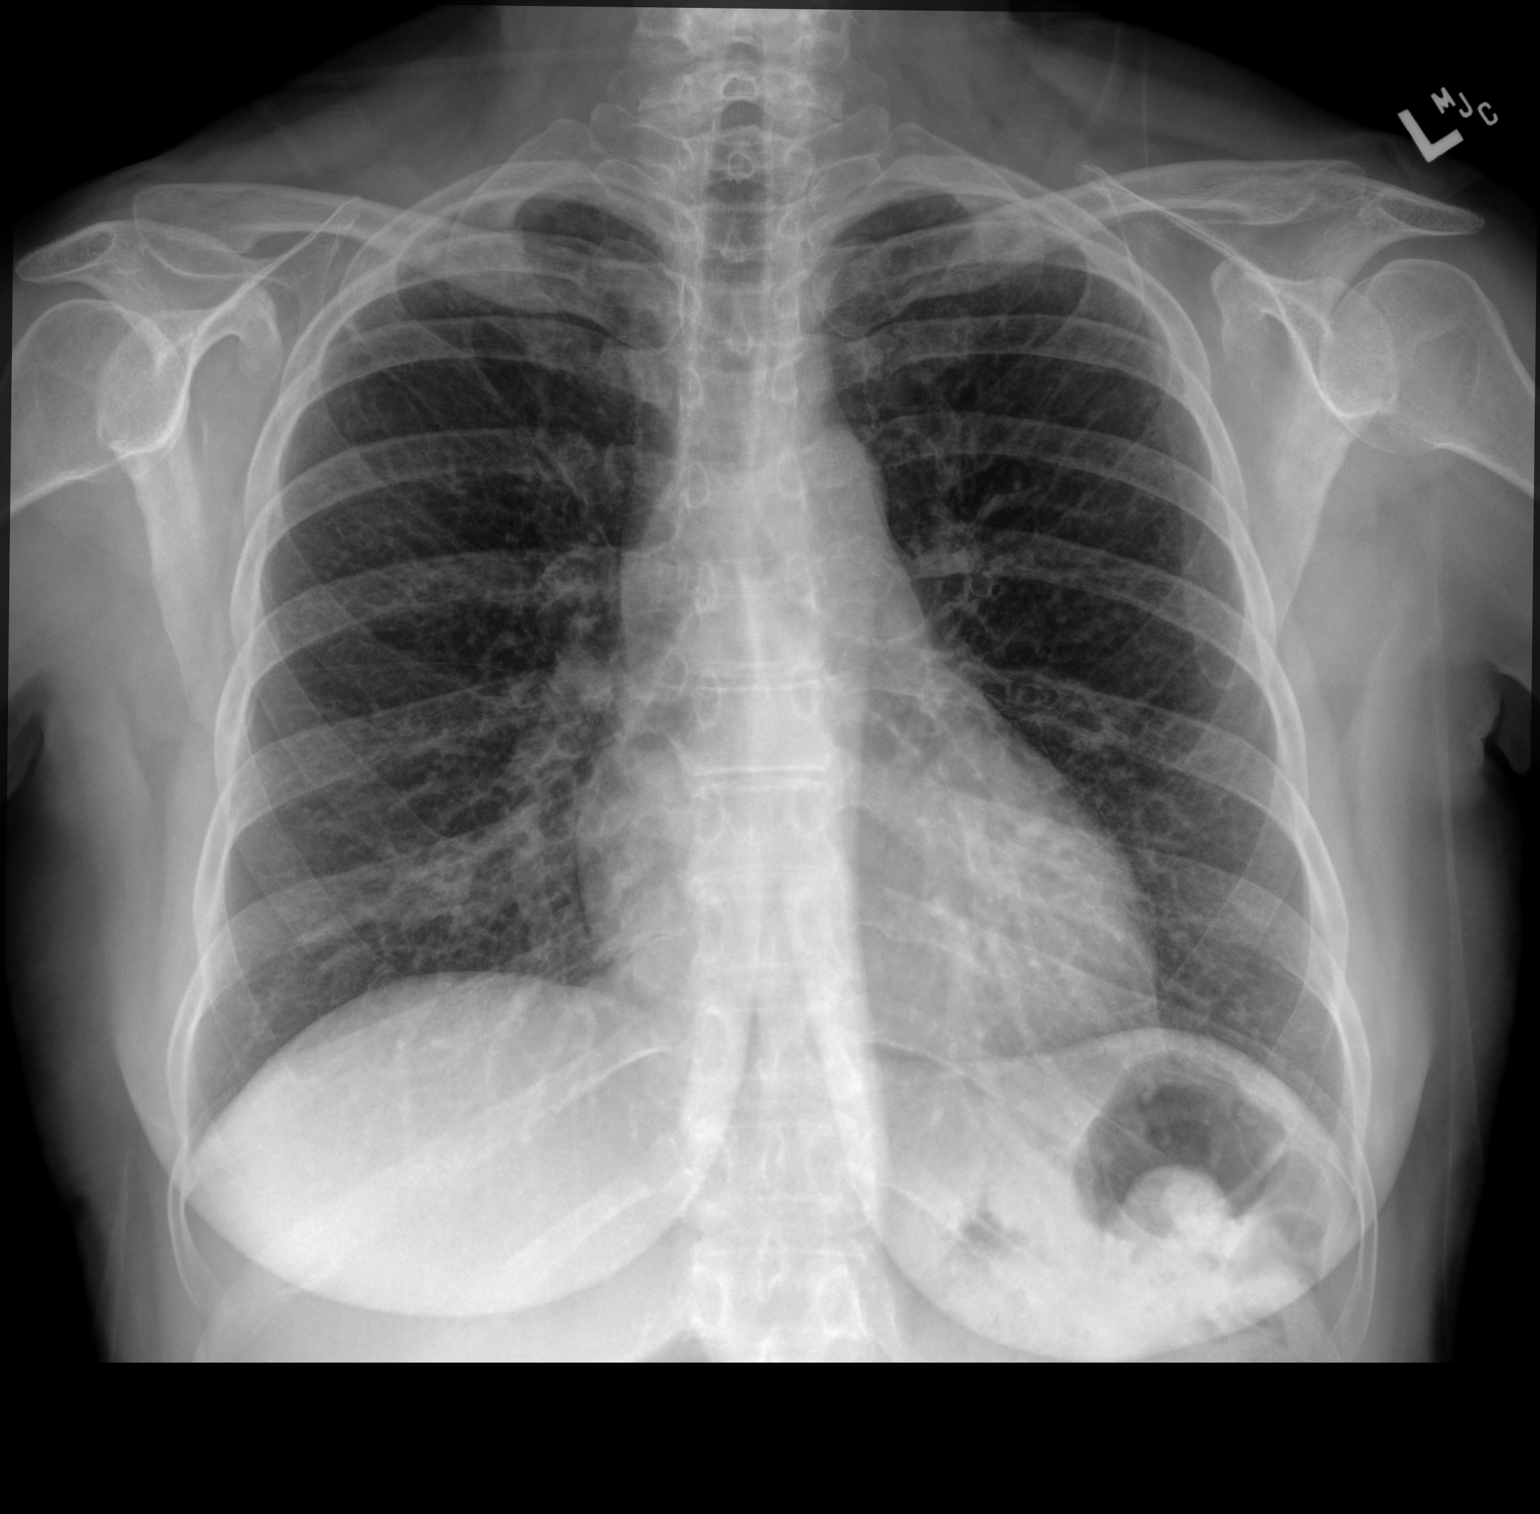

[chest lat]
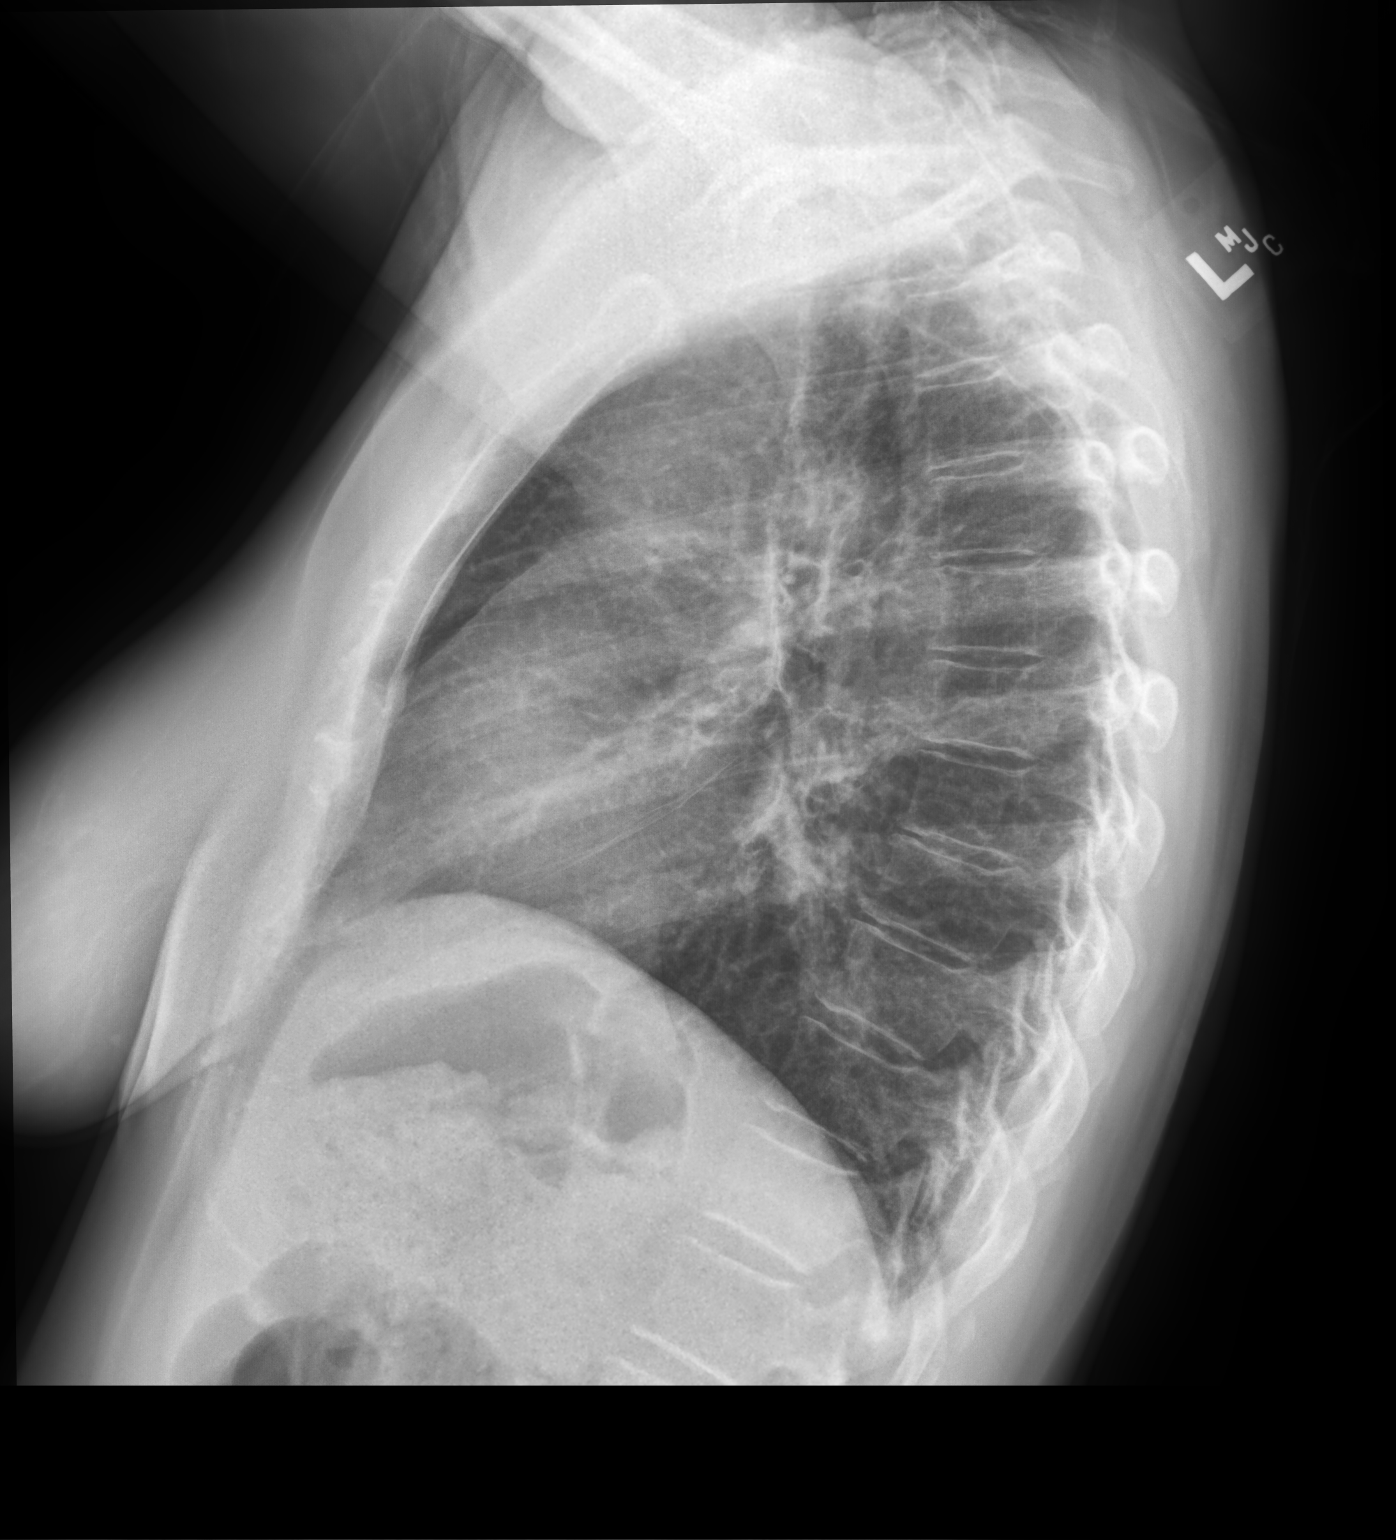

[2 of 2 positions shown; findings below may reference images not displayed]

FINDINGS: Frontal and lateral views of the chest demonstrate an unremarkable
cardiac silhouette. Bronchovascular prominence could reflect
reactive airway disease or bronchitis. No airspace disease,
effusion, or pneumothorax. No acute bony abnormalities.
IMPRESSION: 1. Increased bronchovascular prominence which could reflect
bronchitis or reactive airway disease.
2. No evidence of pneumonia.

## 2019-07-11 MED ORDER — AEROCHAMBER PLUS FLO-VU SMALL MISC: 1.0000 | Freq: Once | 0 refills | Status: AC

## 2019-07-11 MED ORDER — BENZONATATE 100 MG PO CAPS: 100.0000 mg | ORAL_CAPSULE | Freq: Three times a day (TID) | ORAL | 0 refills | Status: DC

## 2019-07-11 MED ORDER — ALBUTEROL SULFATE HFA 108 (90 BASE) MCG/ACT IN AERS: 2.0000 | INHALATION_SPRAY | RESPIRATORY_TRACT | 0 refills | Status: AC | PRN

## 2019-07-11 MED ORDER — PREDNISONE 20 MG PO TABS: 20.0000 mg | ORAL_TABLET | Freq: Every day | ORAL | 0 refills | Status: DC

## 2019-07-11 NOTE — ED Triage Notes (Signed)
Pt c/o productive cough with thick yellow sputum, sore throat and nasal congestion for over a week. States had a neg covid test yesterday.

## 2019-07-11 NOTE — ED Provider Notes (Signed)
Erin Oconnell    [name]: [id number] Arrival date & time: 07/11/19  1841      History   Chief Complaint Chief Complaint  Patient presents with  . Cough    HPI Erin Oconnell is a 59 y.o. female with history of acquired hypothyroidism, eustachian tube dysfunction, sensorineural hearing loss presenting for weeklong course of increased cough, difficulty breathing, nasal congestion.  Cough is productive: Yellow sputum without blood.  Patient has sore throat from coughing as well as some chest pain that is worse with coughing.  Patient denies chest pain at rest or with exertion.  No palpitations, wheezing.  Patient has been taking Dimetapp which has improved her stuffy/runny nose.  Patient did undergo rapid Covid testing at a nursing home yesterday: Negative, no PCR.  No fever, chills, arthralgias, myalgias, lower extremity edema.   Past Medical History:  Diagnosis Date  . Thyroid disease     There are no problems to display for this patient.   Past Surgical History:  Procedure Laterality Date  . CESAREAN SECTION    . MIDDLE EAR SURGERY    . TONSILLECTOMY      OB History    Gravida  1   Para  1   Term  1   Preterm      AB      Living  1     SAB      TAB      Ectopic      Multiple      Live Births               Home Medications    Prior to Admission medications   Medication Sig Start Date End Date Taking? Authorizing Provider  albuterol (VENTOLIN HFA) 108 (90 Base) MCG/ACT inhaler Inhale 2 puffs into the lungs every 4 (four) hours as needed for wheezing or shortness of breath. 07/11/19   Hall-Potvin, [place], PA-C  benzonatate (TESSALON) 100 MG capsule Take 1 capsule (100 mg total) by mouth every 8 (eight) hours. 07/11/19   Hall-Potvin, [place], PA-C  levothyroxine (SYNTHROID, LEVOTHROID) 25 MCG tablet Take 25 mcg by mouth daily before breakfast.    [provider]  Multiple Vitamin (MULITIVITAMIN WITH MINERALS) TABS Take 1 tablet by mouth  daily.    [provider]  predniSONE (DELTASONE) 20 MG tablet Take 1 tablet (20 mg total) by mouth daily. 07/11/19   Hall-Potvin, [place], PA-C  sertraline (ZOLOFT) 100 MG tablet Take 100 mg by mouth daily.      [provider]  Spacer/Aero-Holding Chambers (AEROCHAMBER PLUS FLO-VU SMALL) MISC 1 each by Other route once for 1 dose. 07/11/19 07/11/19  Hall-Potvin, [place], PA-C    Family History Family History  Problem Relation Age of Onset  . Diabetes Mother   . Hypertension Father   . Heart attack Father   . Heart disease Brother   . Hypertension Brother   . Hypertension Brother     Social History Social History   Tobacco Use  . Smoking status: Never Smoker  . Smokeless tobacco: Never Used  Substance Use Topics  . Alcohol use: Yes    Comment: occassional  . Drug use: No     Allergies   Codeine and Vicodin [hydrocodone-acetaminophen]   Review of Systems As per HPI   Physical Exam Triage Vital Signs ED Triage Vitals  Enc Vitals Group     BP      Pulse      Resp  Temp      Temp src      SpO2      Weight      Height      Head Circumference      Peak Flow      Pain Score      Pain Loc      Pain Edu?      Excl. in GC?    No data found.  Updated Vital Signs BP 128/70 (BP Location: Left Arm)   Pulse 76   Temp 98.5 F (36.9 C) (Oral)   Resp 16   SpO2 97%   Visual Acuity Right Eye Distance:   Left Eye Distance:   Bilateral Distance:    Right Eye Near:   Left Eye Near:    Bilateral Near:     Physical Exam Constitutional:      General: She is not in acute distress.    Appearance: She is not ill-appearing or diaphoretic.  HENT:     Head: Normocephalic and atraumatic.     Right Ear: Tympanic membrane, ear canal and external ear normal.     Left Ear: Tympanic membrane, ear canal and external ear normal.     Nose: Nose normal.     Mouth/Throat:     Mouth: Mucous membranes are moist.     Pharynx: Oropharynx is clear. No  oropharyngeal exudate or posterior oropharyngeal erythema.  Eyes:     General: No scleral icterus.    Conjunctiva/sclera: Conjunctivae normal.     Pupils: Pupils are equal, round, and reactive to light.  Neck:     Comments: Trachea midline, negative JVD Cardiovascular:     Rate and Rhythm: Normal rate and regular rhythm.     Heart sounds: No murmur. No gallop.   Pulmonary:     Effort: Pulmonary effort is normal. No respiratory distress.     Breath sounds: No wheezing, rhonchi or rales.     Comments: Decreased sounds in left mid lobe, right lower lobe Musculoskeletal:     Cervical back: Neck supple. No tenderness.  Lymphadenopathy:     Cervical: No cervical adenopathy.  Skin:    Capillary Refill: Capillary refill takes less than 2 seconds.     Coloration: Skin is not jaundiced or pale.     Findings: No rash.  Neurological:     General: No focal deficit present.     Mental Status: She is alert and oriented to person, place, and time.      UC Treatments / Results  Labs (all labs ordered are listed, but only abnormal results are displayed) Labs Reviewed  NOVEL CORONAVIRUS, NAA    EKG   Radiology DG Chest 2 View  Result Date: 07/11/2019 CLINICAL DATA:  Cough for 1 week, nasal congestion EXAM: CHEST - 2 VIEW COMPARISON:  06/11/2006 FINDINGS: Frontal and lateral views of the chest demonstrate an unremarkable cardiac silhouette. Bronchovascular prominence could reflect reactive airway disease or bronchitis. No airspace disease, effusion, or pneumothorax. No acute bony abnormalities. IMPRESSION: 1. Increased bronchovascular prominence which could reflect bronchitis or reactive airway disease. 2. No evidence of pneumonia. Electronically Signed   By: Sharlet Salina M.D.   On: 07/11/2019 19:23    Procedures Procedures (including critical Oconnell time)  Medications Ordered in UC Medications - No data to display  Initial Impression / Assessment and Plan / UC Course  I have reviewed  the triage vital signs and the nursing notes.  Pertinent labs & imaging results that were available  during my Oconnell of the patient were reviewed by me and considered in my medical decision making (see chart for details).     Patient afebrile, nontoxic, and with SpO2 97%.  Patient underwent rapid Covid testing yesterday: Negative.  No PCR to confirm: Offered this to patient who is agreeable.  Covid PCR pending: Patient to continue quarantine until results are back.  Chest x-ray done office, reviewed by me and radiology, compared to previous from 06/11/2006: Bronchovascular prominence noted.  No infiltrate/consolidation concerning for pneumonia.  Bronchovascular prominence could reflect bronchitis or reactive airway disease.  Reviewed findings with patient who verbalized understanding.  Clinically correlates with bronchitis: We will treat as outlined below.  Return precautions discussed, patient verbalized understanding and is agreeable to plan. Final Clinical Impressions(s) / UC Diagnoses   Final diagnoses:  Cough  Acute bronchitis, unspecified organism     Discharge Instructions     Take steroid once every morning with food. May use albuterol inhaler with spacer up to every 4 hours as needed for chest tightness, difficulty breathing, wheezing. Take benzonatate up to 3 times daily for cough. Covid PCR is pending: Recommend you quarantine until results are back.  Please check MyChart for results as we will only call you if results are positive    ED Prescriptions    Medication Sig Dispense Auth. Provider   albuterol (VENTOLIN HFA) 108 (90 Base) MCG/ACT inhaler Inhale 2 puffs into the lungs every 4 (four) hours as needed for wheezing or shortness of breath. 18 g Hall-Potvin, [place], PA-C   Spacer/Aero-Holding Chambers (AEROCHAMBER PLUS FLO-VU SMALL) MISC 1 each by Other route once for 1 dose. 1 each Hall-Potvin, [place], PA-C   predniSONE (DELTASONE) 20 MG tablet Take 1 tablet (20 mg total)  by mouth daily. 5 tablet Hall-Potvin, [place], PA-C   benzonatate (TESSALON) 100 MG capsule Take 1 capsule (100 mg total) by mouth every 8 (eight) hours. 21 capsule Hall-Potvin, [place], PA-C     PDMP not reviewed this encounter.   Neldon Mc [place], [place] 07/11/19 1946

## 2019-07-11 NOTE — Discharge Instructions (Addendum)
Take steroid once every morning with food. May use albuterol inhaler with spacer up to every 4 hours as needed for chest tightness, difficulty breathing, wheezing. Take benzonatate up to 3 times daily for cough. Covid PCR is pending: Recommend you quarantine until results are back.  Please check MyChart for results as we will only call you if results are positive

## 2019-07-12 ENCOUNTER — Telehealth: Payer: Self-pay

## 2019-07-12 NOTE — Telephone Encounter (Signed)
Patient called for  Her COVID-19 test result done as hospital encounter yesterday. Per chart test is still in process. Patient verbalized understanding and will call back.

## 2019-07-13 LAB — SARS-COV-2, NAA 2 DAY TAT

## 2019-07-13 LAB — NOVEL CORONAVIRUS, NAA: SARS-CoV-2, NAA: NOT DETECTED

## 2019-09-16 ENCOUNTER — Other Ambulatory Visit: Payer: Self-pay | Admitting: Internal Medicine

## 2019-12-23 DIAGNOSIS — F424 Excoriation (skin-picking) disorder: Secondary | ICD-10-CM | POA: Insufficient documentation

## 2020-09-22 DIAGNOSIS — D329 Benign neoplasm of meninges, unspecified: Secondary | ICD-10-CM | POA: Insufficient documentation

## 2020-11-09 ENCOUNTER — Other Ambulatory Visit: Payer: Self-pay

## 2020-11-09 ENCOUNTER — Ambulatory Visit (INDEPENDENT_AMBULATORY_CARE_PROVIDER_SITE_OTHER): Payer: 59 | Admitting: Clinical

## 2020-11-09 DIAGNOSIS — F422 Mixed obsessional thoughts and acts: Secondary | ICD-10-CM

## 2020-11-09 DIAGNOSIS — F411 Generalized anxiety disorder: Secondary | ICD-10-CM | POA: Diagnosis not present

## 2020-11-10 NOTE — Progress Notes (Signed)
Comprehensive Clinical Assessment (CCA) Note  11/09/2020 Erin Oconnell [id number]  Chief Complaint:  Chief Complaint  Patient presents with   Depression   Anxiety   Visit Diagnosis:  Generalized anxiety disorder OCD  Interpretive summary: Client is a 60 year old female presenting to the [facility] for outpatient services.  Client reported she is referred by medical provider for clinical assessment.  Client presents with a chief complaint of anxiety, depression, and history of OCD.  Client reported earlier this year she presented to the eye surgeon due to issues with vision loss in her right eye but also has concerns about her left eye.  Client reported applying the eye surgeons examination he determined that she will need to be seen by neurosurgeon for possible other diagnoses.  Client reported she is struggled with excessive worry and depression.  Client reported a previous diagnosis of OCD was made even though by another outpatient psychiatrist in the [place] area.  Client reported the diagnosis is related to the believe/behavior of "if I move things around and situations will get better and I feel better".  Client reported her primary care doctor is currently prescribing her Zoloft 100 mg which is working well for her.  Client reported no history of substance use. Client presents to the appointment oriented x5, appropriately dressed, and friendly.  Client denied hallucinations, delusions, suicidal and homicidal ideation.  Client was screened for pain, nutrition, [place] suicide severity and the following S DOH:  GAD 7 : Generalized Anxiety Score 11/10/2020  Nervous, Anxious, on Edge 2  Control/stop worrying 2  Worry too much - different things 2  Trouble relaxing 2  Restless 2  Easily annoyed or irritable 1  Afraid - awful might happen 1  Total GAD 7 Score 12  Anxiety Difficulty Somewhat difficult     Flowsheet Row Counselor from 11/09/2020 in [facility]  PHQ-9 Total Score 4          Treatment recommendations: Individual therapy.  Client reported she will continue medication management with her primary care physician.  Therapist provided information on format of appointment (virtual or face to face).  The client was advised to call back or seek an in-person evaluation if the symptoms worsen or if the condition fails to improve as anticipated before the next scheduled appointment. Client was in agreement with treatment recommendations.    CCA Biopsychosocial Intake/Chief Complaint:  Client presents with a pre-existing history of OCD.  Client reported within the past 6 months she has had increased depression and anxiety symptoms related to her health.  Current Symptoms/Problems: Client reported depressed mood, feeling on edge, overthinking   Patient Reported Schizophrenia/Schizoaffective Diagnosis in Past: No   Type of Services Patient Feels are Needed: Individual therapy   Initial Clinical Notes/Concerns: No data recorded  Mental Health Symptoms Depression:   Difficulty Concentrating   Duration of Depressive symptoms:  Greater than two weeks   Mania:   None   Anxiety:    Tension; Worrying   Psychosis:   None   Duration of Psychotic symptoms: No data recorded  Trauma:   None   Obsessions:   None   Compulsions:   None   Inattention:   None   Hyperactivity/Impulsivity:   None   Oppositional/Defiant Behaviors:   None   Emotional Irregularity:   None   Other Mood/Personality Symptoms:  No data recorded   Mental Status Exam Appearance and self-care  Stature:   Average   Weight:  Average weight   Clothing:   Casual   Grooming:   Normal   Cosmetic use:   Age appropriate   Posture/gait:   Normal   Motor activity:   Not Remarkable   Sensorium  Attention:   Normal   Concentration:   Normal   Orientation:   X5   Recall/memory:   Normal    Affect and Mood  Affect:   Anxious   Mood:   Anxious   Relating  Eye contact:   Normal   Facial expression:   Responsive   Attitude toward examiner:   Cooperative   Thought and Language  Speech flow:  Clear and Coherent   Thought content:   Appropriate to Mood and Circumstances   Preoccupation:   None   Hallucinations:   None   Organization:  No data recorded  [employer] of Knowledge:   Good   Intelligence:   Average   Abstraction:   Normal   Judgement:   Good   Reality Testing:   Adequate   Insight:   Good   Decision Making:   Normal   Social Functioning  Social Maturity:   Responsible   Social Judgement:   Normal   Stress  Stressors:   Illness   Coping Ability:   Human resources officer Deficits:   Communication   Supports:   Family     Religion: Religion/Spirituality Are You A Religious Person?: No  Leisure/Recreation: Leisure / Recreation Do You Have Hobbies?: Yes  Exercise/Diet: Exercise/Diet Do You Exercise?: No Have You Gained or Lost A Significant Amount of Weight in the Past Six Months?: No Do You Follow a Special Diet?: No Do You Have Any Trouble Sleeping?: No   CCA Employment/Education Employment/Work Situation: Employment / Work Situation Employment Situation: Employed Where is Patient Currently Employed?: [employer]	[place] Satisfied With Your Job?: Yes Do You Work More Than One Job?: No Work Stressors: Client reported her frequent doctor appointments sometimes interfere with scheduling and miscommunication between her supervisors. Patient's Job has Been Impacted by Current Illness: Yes  Education: Education Did Garment/textile technologist From [employer]?: Yes   CCA Family/Childhood History Family and Relationship History: Family history Marital status: Single Does patient have children?: Yes How many children?: 1 How is patient's relationship with their children?: Client reported she has a  31 year old son who is in the [employer].  Client reported to have a good relationship.  Childhood History:     Child/Adolescent Assessment:     CCA Substance Use Alcohol/Drug Use: Alcohol / Drug Use History of alcohol / drug use?: No history of alcohol / drug abuse                         ASAM's:  Six Dimensions of Multidimensional Assessment  Dimension 1:  Acute Intoxication and/or Withdrawal Potential:      Dimension 2:  Biomedical Conditions and Complications:      Dimension 3:  Emotional, Behavioral, or Cognitive Conditions and Complications:     Dimension 4:  Readiness to Change:     Dimension 5:  Relapse, Continued use, or Continued Problem Potential:     Dimension 6:  Recovery/Living Environment:     ASAM Severity Score:    ASAM Recommended Level of Treatment:     Substance use Disorder (SUD)    Recommendations for Services/Supports/Treatments: Recommendations for Services/Supports/Treatments Recommendations For Services/Supports/Treatments: Individual Therapy  DSM5 Diagnoses: There are no problems to display  for this patient.   Patient Centered Plan: Patient is on the following Treatment Plan(s):  Anxiety   Referrals to Alternative Service(s): Referred to Alternative Service(s):   Place:   Date:   Time:    Referred to Alternative Service(s):   Place:   Date:   Time:    Referred to Alternative Service(s):   Place:   Date:   Time:    Referred to Alternative Service(s):   Place:   Date:   Time:     Loree Fee, LCSW

## 2021-01-04 ENCOUNTER — Ambulatory Visit (HOSPITAL_COMMUNITY): Payer: 59 | Admitting: Licensed Clinical Social Worker

## 2021-01-19 ENCOUNTER — Other Ambulatory Visit: Payer: Self-pay | Admitting: Family Medicine

## 2021-01-19 DIAGNOSIS — Z1231 Encounter for screening mammogram for malignant neoplasm of breast: Secondary | ICD-10-CM

## 2021-03-16 ENCOUNTER — Ambulatory Visit: Payer: Self-pay

## 2021-04-15 ENCOUNTER — Other Ambulatory Visit (HOSPITAL_COMMUNITY)
Admission: RE | Admit: 2021-04-15 | Discharge: 2021-04-15 | Disposition: A | Discharge status: Home / Self Care | Payer: No Typology Code available for payment source | Source: Other Acute Inpatient Hospital

## 2021-04-15 DIAGNOSIS — Z029 Encounter for administrative examinations, unspecified: Secondary | ICD-10-CM | POA: Insufficient documentation

## 2021-04-15 LAB — VITAMIN B12: Vitamin B-12: 365 pg/mL (ref 180–914)

## 2021-04-15 LAB — FOLATE: Folate: 20.2 ng/mL (ref >5.9)

## 2021-05-13 DIAGNOSIS — H5231 Anisometropia: Secondary | ICD-10-CM | POA: Insufficient documentation

## 2021-05-13 DIAGNOSIS — H18523 Epithelial (juvenile) corneal dystrophy, bilateral: Secondary | ICD-10-CM | POA: Insufficient documentation

## 2021-05-13 DIAGNOSIS — H18613 Keratoconus, stable, bilateral: Secondary | ICD-10-CM | POA: Insufficient documentation

## 2021-09-01 ENCOUNTER — Ambulatory Visit
Admission: EM | Admit: 2021-09-01 | Discharge: 2021-09-01 | Disposition: A | Discharge status: Home / Self Care | Payer: 59 | Attending: Internal Medicine | Admitting: Internal Medicine

## 2021-09-01 DIAGNOSIS — R051 Acute cough: Secondary | ICD-10-CM | POA: Diagnosis not present

## 2021-09-01 DIAGNOSIS — J029 Acute pharyngitis, unspecified: Secondary | ICD-10-CM | POA: Diagnosis not present

## 2021-09-01 MED ORDER — BENZONATATE 100 MG PO CAPS: 100.0000 mg | ORAL_CAPSULE | Freq: Three times a day (TID) | ORAL | 0 refills | Status: DC | PRN

## 2021-09-01 MED ORDER — AMOXICILLIN 500 MG PO CAPS: 500.0000 mg | ORAL_CAPSULE | Freq: Two times a day (BID) | ORAL | 0 refills | Status: AC

## 2021-09-01 NOTE — ED Provider Notes (Signed)
Erin Oconnell    [name]: [id number] Arrival date & time: 09/01/21  1840      History   Chief Complaint Chief Complaint  Patient presents with   Cough    HPI Erin Oconnell is a 61 y.o. female.   Patient presents with nasal congestion, cough, sore throat, nausea with vomiting, bilateral ear pressure that has been present for approximately 4 days.  Denies any known sick contacts but thinks she may have been exposed to someone at her workplace.  Denies any known fevers at home.  Denies chest pain, shortness of breath, diarrhea, abdominal pain.  Patient has used cough drops with minimal improvement.  Patient reports that her sore throat is her main complaint.   Cough  Past Medical History:  Diagnosis Date   Thyroid disease     There are no problems to display for this patient.   Past Surgical History:  Procedure Laterality Date   CESAREAN SECTION     MIDDLE EAR SURGERY     TONSILLECTOMY      OB History     Gravida  1   Para  1   Term  1   Preterm      AB      Living  1      SAB      IAB      Ectopic      Multiple      Live Births               Home Medications    Prior to Admission medications   Medication Sig Start Date End Date Taking? Authorizing Provider  amoxicillin (AMOXIL) 500 MG capsule Take 1 capsule (500 mg total) by mouth 2 (two) times daily for 10 days. 09/01/21 09/11/21 Yes Alexas Basulto, Acie Fredrickson, FNP  benzonatate (TESSALON) 100 MG capsule Take 1 capsule (100 mg total) by mouth every 8 (eight) hours as needed for cough. 09/01/21  Yes Omelia Marquart, Acie Fredrickson, FNP  albuterol (VENTOLIN HFA) 108 (90 Base) MCG/ACT inhaler Inhale 2 puffs into the lungs every 4 (four) hours as needed for wheezing or shortness of breath. 07/11/19   Hall-Potvin, [place], PA-C  levothyroxine (SYNTHROID, LEVOTHROID) 25 MCG tablet Take 25 mcg by mouth daily before breakfast.    [provider]  Multiple Vitamin (MULITIVITAMIN WITH MINERALS) TABS Take 1 tablet by  mouth daily.    [provider]  predniSONE (DELTASONE) 20 MG tablet Take 1 tablet (20 mg total) by mouth daily. 07/11/19   Hall-Potvin, [place], PA-C  sertraline (ZOLOFT) 100 MG tablet Take 100 mg by mouth daily.      [provider]    Family History Family History  Problem Relation Age of Onset   Diabetes Mother    Hypertension Father    Heart attack Father    Heart disease Brother    Hypertension Brother    Hypertension Brother     Social History Social History   Tobacco Use   Smoking status: Never   Smokeless tobacco: Never  Substance Use Topics   Alcohol use: Yes    Comment: occassional   Drug use: No     Allergies   Codeine and Vicodin [hydrocodone-acetaminophen]   Review of Systems Review of Systems Per HPI  Physical Exam Triage Vital Signs ED Triage Vitals  Enc Vitals Group     BP 09/01/21 1851 (!) 149/84     Pulse Rate 09/01/21 1851 70     Resp 09/01/21 1851 18  Temp 09/01/21 1851 98.1 F (36.7 C)     Temp Source 09/01/21 1851 Oral     SpO2 09/01/21 1851 98 %     Weight --      Height --      Head Circumference --      Peak Flow --      Pain Score 09/01/21 1852 0     Pain Loc --      Pain Edu? --      Excl. in GC? --    No data found.  Updated Vital Signs BP (!) 149/84 (BP Location: Left Arm)   Pulse 70   Temp 98.1 F (36.7 C) (Oral)   Resp 18   SpO2 98%   Visual Acuity Right Eye Distance:   Left Eye Distance:   Bilateral Distance:    Right Eye Near:   Left Eye Near:    Bilateral Near:     Physical Exam Constitutional:      General: She is not in acute distress.    Appearance: Normal appearance. She is not toxic-appearing or diaphoretic.  HENT:     Head: Normocephalic and atraumatic.     Right Ear: Tympanic membrane and ear canal normal.     Left Ear: Tympanic membrane and ear canal normal.     Nose: Congestion present.     Mouth/Throat:     Mouth: Mucous membranes are moist.     Pharynx:  Oropharyngeal exudate and posterior oropharyngeal erythema present.     Tonsils: No tonsillar exudate or tonsillar abscesses.  Eyes:     Extraocular Movements: Extraocular movements intact.     Conjunctiva/sclera: Conjunctivae normal.     Pupils: Pupils are equal, round, and reactive to light.  Cardiovascular:     Rate and Rhythm: Normal rate and regular rhythm.     Pulses: Normal pulses.     Heart sounds: Normal heart sounds.  Pulmonary:     Effort: Pulmonary effort is normal. No respiratory distress.     Breath sounds: Normal breath sounds. No wheezing.  Abdominal:     General: Abdomen is flat. Bowel sounds are normal.     Palpations: Abdomen is soft.  Musculoskeletal:        General: Normal range of motion.     Cervical back: Normal range of motion.  Skin:    General: Skin is warm and dry.  Neurological:     General: No focal deficit present.     Mental Status: She is alert and oriented to person, place, and time. Mental status is at baseline.  Psychiatric:        Mood and Affect: Mood normal.        Behavior: Behavior normal.     UC Treatments / Results  Labs (all labs ordered are listed, but only abnormal results are displayed) Labs Reviewed  NOVEL CORONAVIRUS, NAA  POCT RAPID STREP A (OFFICE)    EKG   Radiology No results found.  Procedures Procedures (including critical Oconnell time)  Medications Ordered in UC Medications - No data to display  Initial Impression / Assessment and Plan / UC Course  I have reviewed the triage vital signs and the nursing notes.  Pertinent labs & imaging results that were available during my Oconnell of the patient were reviewed by me and considered in my medical decision making (see chart for details).     Highly suspicious that patient's symptoms are most likely viral in etiology.  Although, attempted to complete a  strep test but patient was not able to tolerate.  There is minimal exudate with erythematous posterior pharynx on  exam so I do want to cover for strep throat just in case it is present.  COVID test pending as well.  Discussed supportive Oconnell and symptom management with patient.  Discussed return precautions. Patient verbalized understanding and was agreeable with plan. Final Clinical Impressions(s) / UC Diagnoses   Final diagnoses:  Acute cough  Sore throat     Discharge Instructions      You are being treated with an antibiotic.  A cough medication has also been sent to help alleviate your symptoms.  Please follow-up if symptoms persist or worsen.    ED Prescriptions     Medication Sig Dispense Auth. Provider   amoxicillin (AMOXIL) 500 MG capsule Take 1 capsule (500 mg total) by mouth 2 (two) times daily for 10 days. 20 capsule [place], [place]aley E, [place]NP   benzonatate (TESSALON) 100 MG capsule Take 1 capsule (100 mg total) by mouth every 8 (eight) hours as needed for cough. 21 capsule [place], Acie FredricksonHaley E, [place]FNP      PDMP not reviewed this encounter.   Gustavus BryantMound, Ural Acree E, [place]FNP 09/01/21 1925

## 2021-09-01 NOTE — Discharge Instructions (Signed)
You are being treated with an antibiotic.  A cough medication has also been sent to help alleviate your symptoms.  Please follow-up if symptoms persist or worsen.

## 2021-09-01 NOTE — ED Triage Notes (Signed)
Pt c/o emesis, cough, ear pressure

## 2021-09-03 LAB — NOVEL CORONAVIRUS, NAA: SARS-CoV-2, NAA: NOT DETECTED

## 2023-04-11 DIAGNOSIS — E559 Vitamin D deficiency, unspecified: Secondary | ICD-10-CM | POA: Insufficient documentation

## 2023-04-11 DIAGNOSIS — R030 Elevated blood-pressure reading, without diagnosis of hypertension: Secondary | ICD-10-CM | POA: Insufficient documentation

## 2023-09-14 ENCOUNTER — Other Ambulatory Visit (HOSPITAL_COMMUNITY): Payer: Self-pay

## 2023-09-14 MED ORDER — TRAMADOL HCL 50 MG PO TABS
50.0000 mg | ORAL_TABLET | Freq: Four times a day (QID) | ORAL | 0 refills | Status: AC | PRN
Start: 2023-09-14 — End: ?
  Filled 2023-09-14: qty 18, 5d supply, fill #0

## 2023-09-22 ENCOUNTER — Encounter: Payer: Self-pay | Admitting: Emergency Medicine

## 2023-09-22 ENCOUNTER — Ambulatory Visit
Admission: EM | Admit: 2023-09-22 | Discharge: 2023-09-22 | Disposition: A | Discharge status: Home / Self Care | Payer: Medicare (Managed Care)

## 2023-09-22 DIAGNOSIS — L989 Disorder of the skin and subcutaneous tissue, unspecified: Secondary | ICD-10-CM

## 2023-09-22 DIAGNOSIS — R7989 Other specified abnormal findings of blood chemistry: Secondary | ICD-10-CM | POA: Insufficient documentation

## 2023-09-22 DIAGNOSIS — L210 Seborrhea capitis: Secondary | ICD-10-CM | POA: Diagnosis not present

## 2023-09-22 DIAGNOSIS — D496 Neoplasm of unspecified behavior of brain: Secondary | ICD-10-CM | POA: Insufficient documentation

## 2023-09-22 DIAGNOSIS — E7800 Pure hypercholesterolemia, unspecified: Secondary | ICD-10-CM | POA: Insufficient documentation

## 2023-09-22 MED ORDER — KETOCONAZOLE 2 % EX SHAM
1.0000 | MEDICATED_SHAMPOO | CUTANEOUS | 0 refills | Status: AC
Start: 2023-09-25 — End: ?

## 2023-09-22 NOTE — ED Triage Notes (Signed)
 Pt is here due to several sores on scalp x2-3 weeks. Pt notes recurrent hx of these sores and notes she has an issue with picking at them. Mostly concerned about the sore on L posterior side due to it draining clear-yellow liquid recently. Pt is considering switching back to a baby shampoo. She has been putting triple antibiotic cream on the sites. Currently on amoxicillin  due to a tooth extraction.

## 2023-09-22 NOTE — ED Provider Notes (Signed)
 [name]    [name]: [id number] Arrival date & time: 09/22/23  1322      History   Chief Complaint Chief Complaint  Patient presents with   scalp sores    HPI Erin Oconnell is a 63 y.o. female.   Patient presents today with a several week history of lesions on her scalp.  She reports feeling rough areas on her scalp and then has a tendency to pick at them.  She does have an area on her right parietal scalp that will occasionally drain clear fluid but she has not had any purulence.  She does report changing her shampoo recently and wonders if this could have triggered symptoms.  She has tried over-the-counter antidandruff shampoos but never medicated shampoo.  She denies any history of recurrent skin infections and is currently on amoxicillin  for tooth extraction but denies additional antibiotic use.  She denies any additional rash or lesions.  She has not seen a dermatologist in the past.    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   Osteopenia of hip 10/22/2018   Osteopenia of multiple sites 10/22/2018   Acquired hypothyroidism 10/09/2018   Hyperlipidemia 10/09/2018   Increased frequency of urination 10/09/2018   Obsessive-compulsive disorder 10/09/2018   Sensorineural hearing loss (SNHL) of both ears 08/15/2017   Eustachian tube dysfunction, right 07/20/2017   Otorrhea of right ear 07/20/2017    Past Surgical History:  Procedure Laterality Date   CESAREAN SECTION     MIDDLE EAR SURGERY      TONSILLECTOMY      OB History     Gravida  1   Para  1   Term  1   Preterm      AB      Living  1      SAB      IAB      Ectopic      Multiple      Live Births               Home Medications    Prior to Admission medications   Medication Sig Start Date End Date Taking? Authorizing Provider  amoxicillin  (AMOXIL ) 500 MG capsule Take 500 mg by mouth 3 (three) times daily. 09/15/23  Yes [provider]  calcium carbonate (OS-CAL) 1250 (500 Ca) MG chewable tablet Chew by mouth.   Yes [provider]  Cholecalciferol (VITAMIN D-1000 MAX ST) 25 MCG (1000 UT) tablet Take by mouth.   Yes [provider]  cromolyn (OPTICROM) 4 % ophthalmic solution SMARTSIG:1 Drop(s) In Eye(s) Twice Daily PRN 07/07/23  Yes [provider]  ketoconazole (NIZORAL) 2 % shampoo Apply 1 Application topically 2 (two) times a week. 09/25/23  Yes Lavel Rieman K, PA-C  levothyroxine (SYNTHROID) 50 MCG tablet Take 50 mcg by mouth daily. 01/31/22  Yes [provider]  sertraline (ZOLOFT) 100 MG tablet Take 100 mg by mouth daily.     Yes [provider]  traMADol  (ULTRAM ) 50 MG tablet Take 1 tablet (  50 mg total) by mouth every 6 (six) hours as needed for pain 09/14/23  Yes Belcher, Cloyd Dare., DDS  albuterol  (VENTOLIN  HFA) 108 (90 Base) MCG/ACT inhaler Inhale 2 puffs into the lungs every 4 (four) hours as needed for wheezing or shortness of breath. Patient not taking: Reported on 09/22/2023 07/11/19   Hall-Potvin, [place], PA-C  levothyroxine (SYNTHROID, LEVOTHROID) 25 MCG tablet Take 25 mcg by mouth daily before breakfast. Patient not taking: Reported on 09/22/2023    [provider]  Multiple Vitamin (MULITIVITAMIN WITH MINERALS) TABS Take 1 tablet by mouth daily. Patient not taking: Reported on 09/22/2023    [provider]  tobramycin (TOBREX) 0.3 % ophthalmic solution Place 2 drops into the right EAR twice daily for one week. Patient  not taking: Reported on 09/22/2023 02/23/20   [provider]  XIIDRA 5 % SOLN Apply 1 drop to eye 2 (two) times daily. Patient not taking: Reported on 09/22/2023 07/07/23   [provider]    Family History Family History  Problem Relation Age of Onset   Diabetes Mother    Hypertension Father    Heart attack Father    Heart disease Brother    Hypertension Brother    Hypertension Brother     Social History Social History   Tobacco Use   Smoking status: Never   Smokeless tobacco: Never  Vaping Use   Vaping status: Never Used  Substance Use Topics   Alcohol use: Yes    Comment: occassional   Drug use: No     Allergies   Codeine, Hydrocodone-acetaminophen , Hydrocodone, and Vicodin [hydrocodone-acetaminophen ]   Review of Systems Review of Systems  Constitutional:  Negative for activity change, appetite change, fatigue and fever.  Gastrointestinal:  Negative for nausea and vomiting.  Skin:  Positive for wound. Negative for color change.     Physical Exam Triage Vital Signs ED Triage Vitals  Encounter Vitals Group     BP 09/22/23 1447 136/82     Girls Systolic BP Percentile --      Girls Diastolic BP Percentile --      Boys Systolic BP Percentile --      Boys Diastolic BP Percentile --      Pulse Rate 09/22/23 1447 68     Resp 09/22/23 1447 14     Temp 09/22/23 1447 98 F (36.7 C)     Temp Source 09/22/23 1447 Oral     SpO2 09/22/23 1447 97 %     Weight --      Height --      Head Circumference --      Peak Flow --      Pain Score 09/22/23 1441 3     Pain Loc --      Pain Education --      Exclude from Growth Chart --    No data found.  Updated Vital Signs BP 136/82 (BP Location: Left Arm)   Pulse 68   Temp 98 F (36.7 C) (Oral)   Resp 14   SpO2 97%   Visual Acuity Right Eye Distance:   Left Eye Distance:   Bilateral Distance:    Right Eye Near:   Left Eye Near:    Bilateral Near:     Physical Exam Vitals reviewed.   Constitutional:      General: She is awake. She is not in acute distress.    Appearance: Normal appearance. She is well-developed. She is not ill-appearing.  Comments: Very pleasant female appears stated age in no acute distress sitting comfortable in exam room  HENT:     Head: Normocephalic and atraumatic.   Cardiovascular:     Rate and Rhythm: Normal rate and regular rhythm.     Heart sounds: Normal heart sounds, S1 normal and S2 normal. No murmur heard. Pulmonary:     Effort: Pulmonary effort is normal.     Breath sounds: Normal breath sounds. No wheezing, rhonchi or rales.     Comments: Clear to auscultation bilaterally  Skin:    Findings: Rash present. Rash is scaling.     Comments: Scaling lesions noted on scalp with several superficial ulcerations without bleeding or drainage.  There is some evidence of excoriation near these lesions.   Psychiatric:        Behavior: Behavior is cooperative.      UC Treatments / Results  Labs (all labs ordered are listed, but only abnormal results are displayed) Labs Reviewed - No data to display  EKG   Radiology No results found.  Procedures Procedures (including critical care time)  Medications Ordered in UC Medications - No data to display  Initial Impression / Assessment and Plan / UC Course  I have reviewed the triage vital signs and the nursing notes.  Pertinent labs & imaging results that were available during my care of the patient were reviewed by me and considered in my medical decision making (see chart for details).     Patient is well-appearing, afebrile, nontoxic, nontachycardic.  I am concerned for seborrhea capitis given her clinical presentation.  Will treat with ketoconazole twice weekly.  Recommended she use hypoallergenic shampoos in the meantime.  Discussed that her lesions are likely worsening because of manual manipulation and so recommend that she avoid picking at them is much as possible.  She is to  keep the areas clean and can apply plain bacitracin to any lesions that are open until they heal.  I did recommend that she follow-up with dermatology if her symptoms or not improving and she was given the contact information for local provider with instruction to call to schedule appointment.  We discussed that if anything worsens or changes and she has additional lesions, fever, abnormal drainage, nausea, vomiting she needs to be seen immediately.  Strict return precautions given.  All questions answered to patient satisfaction.  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  Use ketoconazole shampoo 2 times a week.  Try not to pick at the areas.  On the areas that are open you can apply a little bit of bacitracin which is available over-the-counter.  Follow-up with dermatology for further evaluation and management.  If anything worsens or changes please return for reevaluation.     ED Prescriptions     Medication Sig Dispense Auth. Provider   ketoconazole (NIZORAL) 2 % shampoo Apply 1 Application topically 2 (two) times a week. 120 mL Jalyn Dutta K, PA-C      PDMP not reviewed this encounter.   Budd Cargo, PA-C 09/22/23 1521

## 2023-09-22 NOTE — Discharge Instructions (Signed)
 I think you have irritation of your oil producing glands on your scalp.  Use ketoconazole shampoo 2 times a week.  Try not to pick at the areas.  On the areas that are open you can apply a little bit of bacitracin which is available over-the-counter.  Follow-up with dermatology for further evaluation and management.  If anything worsens or changes please return for reevaluation.

## 2024-01-25 ENCOUNTER — Ambulatory Visit: Payer: Medicare (Managed Care) | Admitting: Dermatology

## 2024-04-06 ENCOUNTER — Ambulatory Visit: Admission: EM | Admit: 2024-04-06 | Discharge: 2024-04-06 | Disposition: A | Discharge status: Home / Self Care

## 2024-04-06 ENCOUNTER — Encounter: Payer: Self-pay | Admitting: Emergency Medicine

## 2024-04-06 DIAGNOSIS — J209 Acute bronchitis, unspecified: Secondary | ICD-10-CM | POA: Diagnosis not present

## 2024-04-06 MED ORDER — PREDNISONE 50 MG PO TABS: ORAL_TABLET | ORAL | 0 refills | Status: AC

## 2024-04-06 MED ORDER — BENZONATATE 100 MG PO CAPS: 100.0000 mg | ORAL_CAPSULE | Freq: Three times a day (TID) | ORAL | 0 refills | Status: AC

## 2024-04-06 MED ORDER — GUAIFENESIN ER 600 MG PO TB12: 600.0000 mg | ORAL_TABLET | Freq: Two times a day (BID) | ORAL | 0 refills | Status: AC

## 2024-04-06 NOTE — ED Triage Notes (Signed)
 Pt presents c/o cough x several weeks. Pt states,  I haven't really been feeling bad but I have had a lot of mucus. Feels like it's coming from my chest. Like I said I don't really feel bad I just have this cough.  Pt denies any additional sxs.

## 2024-04-06 NOTE — ED Provider Notes (Signed)
 " [name]    [name]: [id number] Arrival date & time: 04/06/24  1411      History   Chief Complaint Chief Complaint  Patient presents with   Cough    HPI Erin Oconnell is a 63 y.o. female.   Presents today due to persistent cough productive of green sputum for the past several weeks.  Patient states that she has been using cough drops for symptoms.  Patient states that she does not feel bad she just has this persistent cough.  Patient states that her cough is worse when lying supine.  Patient denies fever or change in appetite.  The history is provided by the patient.  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   Osteopenia of hip 10/22/2018   Osteopenia of multiple sites 10/22/2018   Acquired hypothyroidism 10/09/2018   Hyperlipidemia 10/09/2018   Increased frequency of urination 10/09/2018   Obsessive-compulsive disorder 10/09/2018   Sensorineural hearing loss (SNHL) of both ears 08/15/2017   Eustachian tube dysfunction, right 07/20/2017   Otorrhea of right ear 07/20/2017    Past Surgical History:  Procedure Laterality Date   CESAREAN SECTION     MIDDLE EAR SURGERY     TONSILLECTOMY      OB History     Gravida  1   Para  1   Term  1   Preterm      AB      Living  1      SAB      IAB      Ectopic      Multiple      Live Births               Home Medications    Prior to Admission medications  Medication Sig Start Date End Date Taking? Authorizing Provider   benzonatate  (TESSALON ) 100 MG capsule Take 1 capsule (100 mg total) by mouth every 8 (eight) hours. 04/06/24  Yes Andra Krabbe C, PA-C  clindamycin (CLEOCIN T) 1 % external solution SMARTSIG:1 Milliliter(s) Topical Twice Daily 12/12/23  Yes [provider]  guaiFENesin  (MUCINEX ) 600 MG 12 hr tablet Take 1 tablet (600 mg total) by mouth 2 (two) times daily for 10 days. 04/06/24 04/16/24 Yes Andra Krabbe BROCKS, PA-C  levothyroxine (SYNTHROID) 50 MCG tablet Take 50 mcg by mouth daily. 01/31/22  Yes [provider]  nabumetone (RELAFEN) 500 MG tablet Take 500 mg by mouth. 03/06/24  Yes [provider]  predniSONE  (DELTASONE ) 50 MG tablet Take 1 tab po daily for 5 days 04/06/24  Yes Andra Krabbe BROCKS, PA-C  albuterol  (VENTOLIN  HFA) 108 (90 Base) MCG/ACT inhaler Inhale 2 puffs into the lungs every 4 (four) hours as needed for wheezing or shortness of breath. Patient not taking: Reported on 09/22/2023 07/11/19   Hall-Potvin, Brittany, PA-C  amoxicillin  (AMOXIL ) 500 MG capsule Take 500 mg by mouth 3 (three) times daily. 09/15/23   [provider]  calcium carbonate (OS-CAL) 1250 (500 Ca) MG chewable tablet Chew by  mouth.    [provider]  Cholecalciferol (VITAMIN D-1000 MAX ST) 25 MCG (1000 UT) tablet Take by mouth.    [provider]  cromolyn (OPTICROM) 4 % ophthalmic solution SMARTSIG:1 Drop(s) In Eye(s) Twice Daily PRN 07/07/23   [provider]  ketoconazole  (NIZORAL ) 2 % shampoo Apply 1 Application topically 2 (two) times a week. 09/25/23   Raspet, Erin K, PA-C  levothyroxine (SYNTHROID, LEVOTHROID) 25 MCG tablet Take 25 mcg by mouth daily before breakfast. Patient not taking: Reported on 09/22/2023    [provider]  Multiple Vitamin (MULITIVITAMIN WITH MINERALS) TABS Take 1 tablet by mouth daily. Patient not taking: Reported on 09/22/2023    [provider]  Oyster Shell Calcium 500 MG TABS 2 tablet Orally daily     [provider]  sertraline (ZOLOFT) 100 MG tablet Take 100 mg by mouth daily.      [provider]  tobramycin (TOBREX) 0.3 % ophthalmic solution Place 2 drops into the right EAR twice daily for one week. Patient not taking: Reported on 09/22/2023 02/23/20   [provider]  traMADol  (ULTRAM ) 50 MG tablet Take 1 tablet (50 mg total) by mouth every 6 (six) hours as needed for pain 09/14/23   Grafton Lynwood LELON Mickey., DDS  XIIDRA 5 % SOLN Apply 1 drop to eye 2 (two) times daily. Patient not taking: Reported on 09/22/2023 07/07/23   [provider]    Family History Family History  Problem Relation Age of Onset   Diabetes Mother    Hypertension Father    Heart attack Father    Heart disease Brother    Hypertension Brother    Hypertension Brother     Social History Social History[1]   Allergies   Codeine, Hydrocodone-acetaminophen , Hydrocodone, and Vicodin [hydrocodone-acetaminophen ]   Review of Systems Review of Systems  Respiratory:  Positive for cough.      Physical Exam Triage Vital Signs ED Triage Vitals  Encounter Vitals Group     BP 04/06/24 1531 (!) 146/80     Girls Systolic BP Percentile --      Girls Diastolic BP Percentile --      Boys Systolic BP Percentile --      Boys Diastolic BP Percentile --      Pulse Rate 04/06/24 1531 71     Resp 04/06/24 1531 18     Temp 04/06/24 1531 98.1 F (36.7 C)     Temp src --      SpO2 04/06/24 1531 96 %     Weight 04/06/24 1542 128 lb (58.1 kg)     Height --      Head Circumference --      Peak Flow --      Pain Score 04/06/24 1540 0     Pain Loc --      Pain Education --      Exclude from Growth Chart --    No data found.  Updated Vital Signs BP (!) 146/80 (BP Location: Left Arm)   Pulse 71   Temp 98.1 F (36.7 C)   Resp 18   Wt 128 lb (58.1 kg)   SpO2 96%   BMI 25.00 kg/m   Visual Acuity Right Eye Distance:   Left Eye Distance:   Bilateral Distance:    Right Eye Near:    Left Eye Near:    Bilateral Near:     Physical Exam Vitals and nursing note reviewed.  Constitutional:  General: She is not in acute distress.    Appearance: Normal appearance. She is not ill-appearing, toxic-appearing or diaphoretic.  Eyes:     General: No scleral icterus. Cardiovascular:     Rate and Rhythm: Normal rate and regular rhythm.     Heart sounds: Normal heart sounds.  Pulmonary:     Effort: Pulmonary effort is normal. No respiratory distress.     Breath sounds: Normal breath sounds. No wheezing or rhonchi.  Skin:    General: Skin is warm.  Neurological:     Mental Status: She is alert and oriented to person, place, and time.  Psychiatric:        Mood and Affect: Mood normal.        Behavior: Behavior normal.      UC Treatments / Results  Labs (all labs ordered are listed, but only abnormal results are displayed) Labs Reviewed - No data to display  EKG   Radiology No results found.  Procedures Procedures (including critical care time)  Medications Ordered in UC Medications - No data to display  Initial Impression / Assessment and Plan / UC Course  I have reviewed the triage vital signs and the nursing notes.  Pertinent labs & imaging results that were available during my care of the patient were reviewed by me and considered in my medical decision making (see chart for details).     Final Clinical Impressions(s) / UC Diagnoses   Final diagnoses:  Acute bronchitis, unspecified organism   Discharge Instructions   None    ED Prescriptions     Medication Sig Dispense Auth. Provider   benzonatate  (TESSALON ) 100 MG capsule Take 1 capsule (100 mg total) by mouth every 8 (eight) hours. 30 capsule Andra Krabbe C, PA-C   guaiFENesin  (MUCINEX ) 600 MG 12 hr tablet Take 1 tablet (600 mg total) by mouth 2 (two) times daily for 10 days. 20 tablet Yanis Larin C, PA-C   predniSONE  (DELTASONE ) 50 MG tablet Take 1 tab po daily for 5 days  5 tablet Andra Krabbe BROCKS, PA-C      PDMP not reviewed this encounter.    [1]  Social History Tobacco Use   Smoking status: Never    Passive exposure: Never   Smokeless tobacco: Never  Vaping Use   Vaping status: Never Used  Substance Use Topics   Alcohol use: Yes    Comment: occassional   Drug use: No     Andra Krabbe BROCKS, PA-C 04/06/24 1610  "
